# Patient Record
Sex: Female | Born: 1990 | Race: Black or African American | Hispanic: No | Marital: Single | Smoking: Never smoker
Health system: Southern US, Community
[De-identification: ages and names within clinical notes are randomized; demographics above are authoritative.]

## PROBLEM LIST (undated history)

## (undated) DIAGNOSIS — F419 Anxiety disorder, unspecified: Secondary | ICD-10-CM

## (undated) DIAGNOSIS — N809 Endometriosis, unspecified: Secondary | ICD-10-CM

---

## 2019-05-05 ENCOUNTER — Encounter (HOSPITAL_COMMUNITY): Payer: Self-pay

## 2019-05-05 ENCOUNTER — Encounter (HOSPITAL_COMMUNITY): Payer: Self-pay | Admitting: Emergency Medicine

## 2019-05-05 ENCOUNTER — Emergency Department (HOSPITAL_COMMUNITY): Payer: Self-pay

## 2019-05-05 ENCOUNTER — Other Ambulatory Visit: Payer: Self-pay

## 2019-05-05 ENCOUNTER — Emergency Department (HOSPITAL_COMMUNITY)
Admission: EM | Admit: 2019-05-05 | Discharge: 2019-05-05 | Disposition: A | Discharge status: Home / Self Care | Payer: Self-pay | Attending: Emergency Medicine | Admitting: Emergency Medicine

## 2019-05-05 DIAGNOSIS — Z79899 Other long term (current) drug therapy: Secondary | ICD-10-CM | POA: Insufficient documentation

## 2019-05-05 DIAGNOSIS — K625 Hemorrhage of anus and rectum: Secondary | ICD-10-CM | POA: Insufficient documentation

## 2019-05-05 DIAGNOSIS — R112 Nausea with vomiting, unspecified: Secondary | ICD-10-CM | POA: Insufficient documentation

## 2019-05-05 DIAGNOSIS — N83201 Unspecified ovarian cyst, right side: Secondary | ICD-10-CM | POA: Insufficient documentation

## 2019-05-05 HISTORY — DX: Endometriosis, unspecified: N80.9

## 2019-05-05 LAB — CBC WITH DIFFERENTIAL/PLATELET
Abs Immature Granulocytes: 0.01 10*3/uL (ref 0.00–0.07)
Basophils Absolute: 0 10*3/uL (ref 0.0–0.1)
Basophils Relative: 1 %
Eosinophils Absolute: 0.1 10*3/uL (ref 0.0–0.5)
Eosinophils Relative: 2 %
HCT: 39.9 % (ref 36.0–46.0)
Hemoglobin: 12.8 g/dL (ref 12.0–15.0)
Immature Granulocytes: 0 %
Lymphocytes Relative: 39 %
Lymphs Abs: 2.2 10*3/uL (ref 0.7–4.0)
MCH: 30.2 pg (ref 26.0–34.0)
MCHC: 32.1 g/dL (ref 30.0–36.0)
MCV: 94.1 fL (ref 80.0–100.0)
Monocytes Absolute: 0.4 10*3/uL (ref 0.1–1.0)
Monocytes Relative: 7 %
Neutro Abs: 2.9 10*3/uL (ref 1.7–7.7)
Neutrophils Relative %: 51 %
Platelets: 212 10*3/uL (ref 150–400)
RBC: 4.24 MIL/uL (ref 3.87–5.11)
RDW: 13.2 % (ref 11.5–15.5)
WBC: 5.7 10*3/uL (ref 4.0–10.5)
nRBC: 0 % (ref 0.0–0.2)

## 2019-05-05 LAB — URINALYSIS, ROUTINE W REFLEX MICROSCOPIC
Bilirubin Urine: NEGATIVE
Glucose, UA: NEGATIVE mg/dL
Hgb urine dipstick: NEGATIVE
Ketones, ur: NEGATIVE mg/dL
Leukocytes,Ua: NEGATIVE
Nitrite: NEGATIVE
Protein, ur: NEGATIVE mg/dL
Specific Gravity, Urine: 1.01 (ref 1.005–1.030)
pH: 6 (ref 5.0–8.0)

## 2019-05-05 LAB — COMPREHENSIVE METABOLIC PANEL
ALT: 13 U/L (ref 0–44)
AST: 21 U/L (ref 15–41)
Albumin: 3.7 g/dL (ref 3.5–5.0)
Alkaline Phosphatase: 42 U/L (ref 38–126)
Anion gap: 11 (ref 5–15)
BUN: 5 mg/dL — ABNORMAL LOW (ref 6–20)
CO2: 21 mmol/L — ABNORMAL LOW (ref 22–32)
Calcium: 9 mg/dL (ref 8.9–10.3)
Chloride: 106 mmol/L (ref 98–111)
Creatinine, Ser: 0.79 mg/dL (ref 0.44–1.00)
GFR calc Af Amer: >60 mL/min (ref >60)
GFR calc non Af Amer: >60 mL/min (ref >60)
Glucose, Bld: 110 mg/dL — ABNORMAL HIGH (ref 70–99)
Potassium: 3.6 mmol/L (ref 3.5–5.1)
Sodium: 138 mmol/L (ref 135–145)
Total Bilirubin: 0.5 mg/dL (ref 0.3–1.2)
Total Protein: 6.6 g/dL (ref 6.5–8.1)

## 2019-05-05 LAB — WET PREP, GENITAL
Clue Cells Wet Prep HPF POC: NONE SEEN
Sperm: NONE SEEN
Trich, Wet Prep: NONE SEEN
Yeast Wet Prep HPF POC: NONE SEEN

## 2019-05-05 LAB — LIPASE, BLOOD: Lipase: 27 U/L (ref 11–51)

## 2019-05-05 LAB — POC URINE PREG, ED: Preg Test, Ur: NEGATIVE

## 2019-05-05 MED ORDER — ONDANSETRON HCL 4 MG/2ML IJ SOLN
4.0000 mg | Freq: Once | INTRAMUSCULAR | Status: AC
  Administered 2019-05-05: 4 mg via INTRAVENOUS
  Filled 2019-05-05: qty 2

## 2019-05-05 MED ORDER — MORPHINE SULFATE (PF) 4 MG/ML IV SOLN
4.0000 mg | Freq: Once | INTRAVENOUS | Status: AC
  Administered 2019-05-05: 4 mg via INTRAVENOUS
  Filled 2019-05-05: qty 1

## 2019-05-05 MED ORDER — IOPAMIDOL (ISOVUE-300) INJECTION 61%
100.0000 mL | Freq: Once | INTRAVENOUS | Status: AC | PRN
  Administered 2019-05-05: 100 mL via INTRAVENOUS

## 2019-05-05 MED ORDER — ONDANSETRON HCL 4 MG PO TABS: 4.0000 mg | ORAL_TABLET | Freq: Four times a day (QID) | ORAL | 0 refills | Status: DC

## 2019-05-05 MED ORDER — NAPROXEN 500 MG PO TABS: 500.0000 mg | ORAL_TABLET | Freq: Two times a day (BID) | ORAL | 0 refills | Status: DC

## 2019-05-05 MED ORDER — KETOROLAC TROMETHAMINE 15 MG/ML IJ SOLN
15.0000 mg | Freq: Once | INTRAMUSCULAR | Status: AC
  Administered 2019-05-05: 15 mg via INTRAVENOUS
  Filled 2019-05-05: qty 1

## 2019-05-05 NOTE — ED Provider Notes (Signed)
[facility] EMERGENCY DEPARTMENT Provider Note   CSN: [id number] Arrival date & time: 05/05/19  0935    History   Chief Complaint Chief Complaint  Patient presents with  . Rectal Bleeding  . Emesis    HPI Gloria Gonzales is a 28 y.o. female.     Patient is a 28 year old female with questionable history of endometriosis presenting to the emergency department for pelvic pain, rectal bleeding, nausea.  Patient reports that this is been going on for several months.  Reports that she was seeing OB/GYN last year who thought she might possibly have endometriosis but she was lost to follow-up.  Reports that she has not been able to get into see a new OB/GYN because of the coronavirus.  Reports 7 out of 10 lower pelvic pain and cramping every single day.  No exacerbating or relieving factors.  Reports that she has had a history of irregular periods since she was a child.  Reports that 3 times in the last several months she had some rectal bleeding.  Reports that yesterday it was worse with a large amount of bleeding.  No current rectal bleeding.  No vaginal bleeding, vaginal discharge.  Reports feeling nauseated without vomiting.  Denies any fever, chills.     Past Medical History:  Diagnosis Date  . Endometriosis     There are no active problems to display for this patient.   History reviewed. No pertinent surgical history.   OB History    Gravida  1   Para      Term      Preterm      AB      Living        SAB      TAB      Ectopic      Multiple      Live Births               Home Medications    Prior to Admission medications   Medication Sig Start Date End Date Taking? Authorizing Provider  Multiple Vitamin (MULTIVITAMIN WITH MINERALS) TABS tablet Take 1 tablet by mouth daily.   Yes [provider]  naproxen (NAPROSYN) 500 MG tablet Take 1 tablet (500 mg total) by mouth 2 (two) times daily. 05/05/19   Ronnie Doss A, PA-C   ondansetron (ZOFRAN) 4 MG tablet Take 1 tablet (4 mg total) by mouth every 6 (six) hours. 05/05/19   Arlyn Dunning, PA-C    Family History No family history on file.  Social History Social History   Tobacco Use  . Smoking status: Never Smoker  Substance Use Topics  . Alcohol use: Never    Frequency: Never  . Drug use: Never     Allergies   Clonazepam   Review of Systems Review of Systems  Constitutional: Negative for chills and fever.  HENT: Negative for ear pain and sore throat.   Eyes: Negative for pain and visual disturbance.  Respiratory: Negative for cough and shortness of breath.   Cardiovascular: Negative for chest pain and palpitations.  Gastrointestinal: Positive for abdominal pain, anal bleeding, blood in stool, nausea and rectal pain. Negative for abdominal distention, constipation, diarrhea and vomiting.  Genitourinary: Positive for menstrual problem and pelvic pain. Negative for difficulty urinating, dyspareunia, dysuria, flank pain, hematuria, vaginal bleeding, vaginal discharge and vaginal pain.  Musculoskeletal: Negative for arthralgias and back pain.  Skin: Negative for color change and rash.  Neurological: Negative for seizures, syncope and light-headedness.  All other systems reviewed and are negative.    Physical Exam Updated Vital Signs BP (!) 164/101 (BP Location: Right Arm)   Pulse 62   Temp 98.6 F (37 C) (Oral)   Resp 18   Ht 5\' 6"  (1.676 m)   Wt 94.8 kg   LMP 04/12/2019   SpO2 98%   BMI 33.73 kg/m   Physical Exam Vitals signs and nursing note reviewed. Exam conducted with a chaperone present.  Constitutional:      Appearance: Normal appearance.  HENT:     Head: Normocephalic.     Mouth/Throat:     Mouth: Mucous membranes are moist.     Pharynx: Oropharynx is clear.  Eyes:     Conjunctiva/sclera: Conjunctivae normal.  Cardiovascular:     Rate and Rhythm: Normal rate and regular rhythm.  Pulmonary:     Effort: Pulmonary effort  is normal.     Breath sounds: Normal breath sounds.  Abdominal:     General: Abdomen is flat. Bowel sounds are normal. There is no distension.     Tenderness: There is no abdominal tenderness. There is no left CVA tenderness, guarding or rebound.  Genitourinary:    General: Normal vulva.     Exam position: Supine.     Pubic Area: No rash or pubic lice.      Vagina: Normal.     Cervix: Normal.     Uterus: Not tender.      Adnexa: Right adnexa normal and left adnexa normal.  Skin:    General: Skin is dry.     Capillary Refill: Capillary refill takes less than 2 seconds.  Neurological:     Mental Status: She is alert.  Psychiatric:        Mood and Affect: Mood normal.      ED Treatments / Results  Labs (all labs ordered are listed, but only abnormal results are displayed) Labs Reviewed  WET PREP, GENITAL - Abnormal; Notable for the following components:      Result Value   WBC, Wet Prep HPF POC MODERATE (*)    All other components within normal limits  COMPREHENSIVE METABOLIC PANEL - Abnormal; Notable for the following components:   CO2 21 (*)    Glucose, Bld 110 (*)    BUN 5 (*)    All other components within normal limits  CBC WITH DIFFERENTIAL/PLATELET  LIPASE, BLOOD  URINALYSIS, ROUTINE W REFLEX MICROSCOPIC  OCCULT BLOOD X 1 CARD TO LAB, STOOL  POC URINE PREG, ED  GC/CHLAMYDIA PROBE AMP ([place]) NOT AT [facility]    EKG None  Radiology Ct Abdomen Pelvis W Contrast  Result Date: 05/05/2019 CLINICAL DATA:  Initial evaluation for abdominal pain and distension since December. EXAM: CT ABDOMEN AND PELVIS WITH CONTRAST TECHNIQUE: Multidetector CT imaging of the abdomen and pelvis was performed using the standard protocol following bolus administration of intravenous contrast. CONTRAST:  100 cc of Omni 300. COMPARISON:  None. FINDINGS: Lower chest: Visualized lung bases are clear. Hepatobiliary: 13 mm hypodense lesion within the posterior right hepatic lobe, indeterminate  (series 3, image 26). Focal fat deposition noted adjacent to the falciform ligament. Liver otherwise unremarkable. Gallbladder within normal limits. No biliary dilatation. Pancreas: Pancreas within normal limits. Spleen: Spleen within normal limits. Adrenals/Urinary Tract: Adrenal glands are normal. Kidneys equal in size with symmetric enhancement. No nephrolithiasis, hydronephrosis, or focal enhancing renal mass. No appreciable hydroureter. Partially distended bladder within normal limits. Stomach/Bowel: Stomach within normal limits. No evidence for bowel obstruction.  Normal appendix. No acute inflammatory changes seen about the bowels. Vascular/Lymphatic: Normal intravascular enhancement seen throughout the intra-abdominal aorta and its branch vessels. No pathologically enlarged intra-abdominal or pelvic lymph nodes. Reproductive: Uterus within normal limits. Left ovary unremarkable. 18 mm degenerating corpus luteal cyst noted within the right ovary. Other: Small volume free physiologic fluid present within the pelvis and right adnexa. No free intraperitoneal air. Musculoskeletal: No acute osseous abnormality. No discrete lytic or blastic osseous lesions. IMPRESSION: 1. 18 mm degenerating right ovarian corpus luteal cyst with associated small volume free physiologic fluid within the pelvis. 2. No other acute abnormality identified within the abdomen and pelvis. 3. 13 mm hypodense lesion within the posterior right hepatic lobe, indeterminate. While this finding is suspected to a benign hemangioma, this is incompletely characterized on this examination. Follow-up with nonemergent dedicated MRI of the abdomen, with without contrast, is suggested for complete evaluation. Electronically Signed   By: Rise Mu M.D.   On: 05/05/2019 13:52    Procedures Procedures (including critical care time)  Medications Ordered in ED Medications  morphine 4 MG/ML injection 4 mg (has no administration in time range)   ketorolac (TORADOL) 15 MG/ML injection 15 mg (15 mg Intravenous Given 05/05/19 1307)  ondansetron (ZOFRAN) injection 4 mg (4 mg Intravenous Given 05/05/19 1307)  iopamidol (ISOVUE-300) 61 % injection 100 mL (100 mLs Intravenous Contrast Given 05/05/19 1333)  ondansetron (ZOFRAN) injection 4 mg (4 mg Intravenous Given 05/05/19 1424)     Initial Impression / Assessment and Plan / ED Course  I have reviewed the triage vital signs and the nursing notes.  Pertinent labs & imaging results that were available during my care of the patient were reviewed by me and considered in my medical decision making (see chart for details).  Clinical Course as of May 04 1424  Fri May 05, 2019  1022 27 y/o F with pelvic pain for several months and rectal bleeding x3 in the last several months. She could not get a new obgyn so she came to the ED. No active bleeding. Unsure if the two problems are related or not. Will obtain labs, pelvic exam, UA, CT scan. Likely non-emergent process and can f/u with obgyn and PMD if workup negative.    [KM]  1416 Labs appearing normal. CT showing right ovarian cyst and probable hepatic hemangioma, both if these things discussed with patient as well as proper follow up. Discussed with patient to f/u Obgyn. Send home with NSAIDs and ondansetron   [KM]    Clinical Course User Index [KM] Arlyn Dunning, PA-C       Based on review of vitals, medical screening exam, lab work and/or imaging, there does not appear to be an acute, emergent etiology for the patient's symptoms. Counseled pt on good return precautions and encouraged both PCP and ED follow-up as needed.  Prior to discharge, I also discussed incidental imaging findings with patient in detail and advised appropriate, recommended follow-up in detail.  Clinical Impression: 1. Rectal bleeding   2. Non-intractable vomiting with nausea, unspecified vomiting type   3. Ovarian cyst, right     Disposition: Discharge  Prior to  providing a prescription for a controlled substance, I independently reviewed the patient's recent prescription history on the [place] Controlled Substance Reporting System. The patient had no recent or regular prescriptions and was deemed appropriate for a brief, less than 3 day prescription of narcotic for acute analgesia.  This note was prepared with assistance of Dragon voice  recognition software. Occasional wrong-word or sound-a-like substitutions may have occurred due to the inherent limitations of voice recognition software.   Final Clinical Impressions(s) / ED Diagnoses   Final diagnoses:  Rectal bleeding  Non-intractable vomiting with nausea, unspecified vomiting type  Ovarian cyst, right    ED Discharge Orders         Ordered    ondansetron (ZOFRAN) 4 MG tablet  Every 6 hours     05/05/19 1420    naproxen (NAPROSYN) 500 MG tablet  2 times daily     05/05/19 [address] 05/05/19 1425    Cur[place], Adam, DO 05/05/19 1607

## 2019-05-05 NOTE — Discharge Instructions (Signed)
Thank you for allowing me to care for you today. Please return to the emergency department if you have new or worsening symptoms. Take your medications as instructed.  ° °

## 2019-05-05 NOTE — ED Triage Notes (Signed)
History of endometriosis. Patient developed abdominal cramping in Nov 2019 not during her cycle and in Dec 2019 thought she had the stomach flu. Since then states had rectal bleeding 2-3 times and yesterday stated "a lot of bright blood came out of her rectum. Since having these symptoms states having in Dec patient having diarrhea and emesis. Last BM was 0300 today diarrhea loose stool brown with vomiting this morning.

## 2019-05-08 LAB — GC/CHLAMYDIA PROBE AMP (~~LOC~~) NOT AT ARMC
Chlamydia: NEGATIVE
Neisseria Gonorrhea: NEGATIVE

## 2019-05-11 ENCOUNTER — Emergency Department (HOSPITAL_BASED_OUTPATIENT_CLINIC_OR_DEPARTMENT_OTHER)
Admission: EM | Admit: 2019-05-11 | Discharge: 2019-05-12 | Disposition: A | Discharge status: Home / Self Care | Payer: Self-pay | Attending: Emergency Medicine | Admitting: Emergency Medicine

## 2019-05-11 ENCOUNTER — Emergency Department (HOSPITAL_BASED_OUTPATIENT_CLINIC_OR_DEPARTMENT_OTHER): Payer: Self-pay

## 2019-05-11 ENCOUNTER — Encounter (HOSPITAL_BASED_OUTPATIENT_CLINIC_OR_DEPARTMENT_OTHER): Payer: Self-pay | Admitting: *Deleted

## 2019-05-11 ENCOUNTER — Other Ambulatory Visit: Payer: Self-pay

## 2019-05-11 DIAGNOSIS — R1031 Right lower quadrant pain: Secondary | ICD-10-CM | POA: Insufficient documentation

## 2019-05-11 DIAGNOSIS — B9689 Other specified bacterial agents as the cause of diseases classified elsewhere: Secondary | ICD-10-CM

## 2019-05-11 DIAGNOSIS — R112 Nausea with vomiting, unspecified: Secondary | ICD-10-CM | POA: Insufficient documentation

## 2019-05-11 DIAGNOSIS — N760 Acute vaginitis: Secondary | ICD-10-CM | POA: Insufficient documentation

## 2019-05-11 DIAGNOSIS — R197 Diarrhea, unspecified: Secondary | ICD-10-CM | POA: Insufficient documentation

## 2019-05-11 DIAGNOSIS — Z79899 Other long term (current) drug therapy: Secondary | ICD-10-CM | POA: Insufficient documentation

## 2019-05-11 LAB — COMPREHENSIVE METABOLIC PANEL
ALT: 11 U/L (ref 0–44)
AST: 15 U/L (ref 15–41)
Albumin: 4.6 g/dL (ref 3.5–5.0)
Alkaline Phosphatase: 45 U/L (ref 38–126)
Anion gap: 11 (ref 5–15)
BUN: 8 mg/dL (ref 6–20)
CO2: 23 mmol/L (ref 22–32)
Calcium: 9.1 mg/dL (ref 8.9–10.3)
Chloride: 105 mmol/L (ref 98–111)
Creatinine, Ser: 0.79 mg/dL (ref 0.44–1.00)
GFR calc Af Amer: >60 mL/min (ref >60)
GFR calc non Af Amer: >60 mL/min (ref >60)
Glucose, Bld: 113 mg/dL — ABNORMAL HIGH (ref 70–99)
Potassium: 3.8 mmol/L (ref 3.5–5.1)
Sodium: 139 mmol/L (ref 135–145)
Total Bilirubin: 0.9 mg/dL (ref 0.3–1.2)
Total Protein: 7.9 g/dL (ref 6.5–8.1)

## 2019-05-11 LAB — WET PREP, GENITAL
Sperm: NONE SEEN
Trich, Wet Prep: NONE SEEN
Yeast Wet Prep HPF POC: NONE SEEN

## 2019-05-11 LAB — URINALYSIS, MICROSCOPIC (REFLEX)

## 2019-05-11 LAB — URINALYSIS, ROUTINE W REFLEX MICROSCOPIC
Bilirubin Urine: NEGATIVE
Glucose, UA: NEGATIVE mg/dL
Ketones, ur: 40 mg/dL — AB
Leukocytes,Ua: NEGATIVE
Nitrite: NEGATIVE
Protein, ur: NEGATIVE mg/dL
Specific Gravity, Urine: >1.03 — ABNORMAL HIGH (ref 1.005–1.030)
pH: 6 (ref 5.0–8.0)

## 2019-05-11 LAB — CBC
HCT: 40.3 % (ref 36.0–46.0)
Hemoglobin: 13.2 g/dL (ref 12.0–15.0)
MCH: 30.6 pg (ref 26.0–34.0)
MCHC: 32.8 g/dL (ref 30.0–36.0)
MCV: 93.3 fL (ref 80.0–100.0)
Platelets: 239 10*3/uL (ref 150–400)
RBC: 4.32 MIL/uL (ref 3.87–5.11)
RDW: 12.9 % (ref 11.5–15.5)
WBC: 7 10*3/uL (ref 4.0–10.5)
nRBC: 0 % (ref 0.0–0.2)

## 2019-05-11 LAB — LIPASE, BLOOD: Lipase: 28 U/L (ref 11–51)

## 2019-05-11 LAB — PREGNANCY, URINE: Preg Test, Ur: NEGATIVE

## 2019-05-11 MED ORDER — MORPHINE SULFATE (PF) 4 MG/ML IV SOLN
4.0000 mg | Freq: Once | INTRAVENOUS | Status: AC
  Administered 2019-05-11: 23:00:00 4 mg via INTRAVENOUS
  Filled 2019-05-11: qty 1

## 2019-05-11 MED ORDER — IOHEXOL 300 MG/ML  SOLN
100.0000 mL | Freq: Once | INTRAMUSCULAR | Status: AC | PRN
  Administered 2019-05-11: 23:00:00 100 mL via INTRAVENOUS

## 2019-05-11 MED ORDER — ONDANSETRON HCL 4 MG/2ML IJ SOLN
4.0000 mg | Freq: Once | INTRAMUSCULAR | Status: AC | PRN
  Administered 2019-05-11: 4 mg via INTRAVENOUS
  Filled 2019-05-11: qty 2

## 2019-05-11 MED ORDER — METRONIDAZOLE 500 MG PO TABS: 500.0000 mg | ORAL_TABLET | Freq: Two times a day (BID) | ORAL | 0 refills | Status: DC

## 2019-05-11 MED ORDER — SODIUM CHLORIDE 0.9 % IV BOLUS
1000.0000 mL | Freq: Once | INTRAVENOUS | Status: AC
  Administered 2019-05-11: 22:00:00 1000 mL via INTRAVENOUS

## 2019-05-11 NOTE — ED Provider Notes (Signed)
[facility] EMERGENCY DEPARTMENT Provider Note   CSN: [id number] Arrival date & time: 05/11/19  2034    History   Chief Complaint Chief Complaint  Patient presents with   Nausea    HPI Gloria Gonzales is a 28 y.o. female with a PMH of endometriosis and ovarian cyst presenting with nausea, vomiting, diarrhea, and RLQ abdominal pain. Patient reports nausea and vomiting began a few weeks ago. Patient reports abdominal pain has been present since December. Patient states pain has been worse today. Patient states she started her period today. Patient reports her pain is typically worse during her menstrual period. Patient denies taking any medications. Patient reports 10 episodes of non bloody non bilious vomiting over the last 24 hours. Patient reports 3 episodes of light brown non bloody diarrhea. Patient denies fever, chills, cough, congestion, sore throat, chest pain, shortness of breath, sick exposures, or recent travel. Patient denies dysuria, hematuria, or flank pain. Patient denies vaginal discharge or hx of STIs. Patient denies any abdominal surgeries. Patient states she has not followed up with an OBGYN in a few years. Patient states she has not followed up with GI in the past.      HPI  Past Medical History:  Diagnosis Date   Endometriosis     There are no active problems to display for this patient.   History reviewed. No pertinent surgical history.   OB History    Gravida  1   Para      Term      Preterm      AB      Living        SAB      TAB      Ectopic      Multiple      Live Births               Home Medications    Prior to Admission medications   Medication Sig Start Date End Date Taking? Authorizing Provider  Multiple Vitamin (MULTIVITAMIN WITH MINERALS) TABS tablet Take 1 tablet by mouth daily.    [provider]  naproxen (NAPROSYN) 500 MG tablet Take 1 tablet (500 mg total) by mouth 2 (two) times daily. 05/05/19    Ronnie Doss A, PA-C  ondansetron (ZOFRAN) 4 MG tablet Take 1 tablet (4 mg total) by mouth every 6 (six) hours. 05/05/19   Arlyn Dunning, PA-C    Family History No family history on file.  Social History Social History   Tobacco Use   Smoking status: Never Smoker   Smokeless tobacco: Never Used  Substance Use Topics   Alcohol use: Never    Frequency: Never   Drug use: Never     Allergies   Clonazepam   Review of Systems Review of Systems  Constitutional: Negative for activity change, appetite change, chills, fever and unexpected weight change.  HENT: Negative for congestion, rhinorrhea and sore throat.   Eyes: Negative for visual disturbance.  Respiratory: Negative for cough and shortness of breath.   Cardiovascular: Negative for chest pain.  Gastrointestinal: Positive for abdominal pain, diarrhea, nausea and vomiting. Negative for blood in stool and constipation.  Endocrine: Negative for polydipsia, polyphagia and polyuria.  Genitourinary: Negative for dysuria, flank pain and frequency.  Musculoskeletal: Negative for back pain.  Skin: Negative for rash.  Allergic/Immunologic: Negative for immunocompromised state.  Neurological: Negative for weakness and headaches.  Psychiatric/Behavioral: The patient is not nervous/anxious.     Physical Exam Updated Vital Signs  BP (!) 124/94    Pulse 70    Temp 98.1 F (36.7 C) (Oral)    Resp 20    Ht 5\' 6"  (1.676 m)    Wt 94.8 kg    LMP 04/12/2019    SpO2 99%    Breastfeeding Unknown    BMI 33.73 kg/m   Physical Exam Vitals signs and nursing note reviewed. Exam conducted with a chaperone present.  Constitutional:      General: She is not in acute distress.    Appearance: She is well-developed. She is not diaphoretic.     Comments: Patient appears uncomfortable during exam.  HENT:     Head: Normocephalic and atraumatic.     Nose: Nose normal. No rhinorrhea.     Mouth/Throat:     Mouth: Mucous membranes are dry.      Pharynx: No posterior oropharyngeal erythema.  Neck:     Musculoskeletal: Normal range of motion and neck supple.  Cardiovascular:     Rate and Rhythm: Normal rate and regular rhythm.     Heart sounds: Normal heart sounds. No murmur. No friction rub. No gallop.   Pulmonary:     Effort: Pulmonary effort is normal. No respiratory distress.     Breath sounds: Normal breath sounds. No wheezing or rales.  Abdominal:     General: Bowel sounds are normal. There is no distension.     Palpations: Abdomen is soft. Abdomen is not rigid. There is no mass.     Tenderness: There is abdominal tenderness in the right lower quadrant. There is guarding. There is no right CVA tenderness or rebound. Positive signs include McBurney's sign.     Hernia: No hernia is present.  Genitourinary:    Labia:        Right: No tenderness or lesion.        Left: No tenderness or lesion.      Vagina: No signs of injury. Bleeding present. No vaginal discharge, erythema, tenderness or lesions.     Cervix: Cervical bleeding present. No cervical motion tenderness or discharge.     Uterus: Normal.      Adnexa: Right adnexa normal and left adnexa normal.     Comments: Bleeding present on exam consistent with menstruation. No signs of injury. No discharge.  Musculoskeletal: Normal range of motion.  Skin:    General: Skin is warm.     Findings: No rash.  Neurological:     Mental Status: She is alert and oriented to person, place, and time.    ED Treatments / Results  Labs (all labs ordered are listed, but only abnormal results are displayed) Labs Reviewed  WET PREP, GENITAL - Abnormal; Notable for the following components:      Result Value   Clue Cells Wet Prep HPF POC PRESENT (*)    WBC, Wet Prep HPF POC MODERATE (*)    All other components within normal limits  COMPREHENSIVE METABOLIC PANEL - Abnormal; Notable for the following components:   Glucose, Bld 113 (*)    All other components within normal limits    URINALYSIS, ROUTINE W REFLEX MICROSCOPIC - Abnormal; Notable for the following components:   APPearance HAZY (*)    Specific Gravity, Urine >1.030 (*)    Hgb urine dipstick LARGE (*)    Ketones, ur 40 (*)    All other components within normal limits  URINALYSIS, MICROSCOPIC (REFLEX) - Abnormal; Notable for the following components:   Bacteria, UA RARE (*)  All other components within normal limits  LIPASE, BLOOD  CBC  PREGNANCY, URINE  GC/CHLAMYDIA PROBE AMP ([place]) NOT AT [facility]    EKG None  Radiology Ct Abdomen Pelvis W Contrast  Result Date: 05/11/2019 CLINICAL DATA:  Abdominal pain and nausea for 5 months EXAM: CT ABDOMEN AND PELVIS WITH CONTRAST TECHNIQUE: Multidetector CT imaging of the abdomen and pelvis was performed using the standard protocol following bolus administration of intravenous contrast. CONTRAST:  100 mL OMNIPAQUE IOHEXOL 300 MG/ML  SOLN COMPARISON:  None. FINDINGS: Lower chest: Lung bases clear.  No pleural or pericardial effusion. Hepatobiliary: No focal liver abnormality is seen. No gallstones, gallbladder wall thickening, or biliary dilatation. Pancreas: Unremarkable. No pancreatic ductal dilatation or surrounding inflammatory changes. Spleen: Normal in size without focal abnormality. Adrenals/Urinary Tract: Adrenal glands are unremarkable. Kidneys are normal, without renal calculi, focal lesion, or hydronephrosis. Bladder is unremarkable. Stomach/Bowel: Stomach is within normal limits. Appendix appears normal. No evidence of bowel wall thickening, distention, or inflammatory changes. Vascular/Lymphatic: No significant vascular findings are present. No enlarged abdominal or pelvic lymph nodes. Reproductive: Uterus and bilateral adnexa are unremarkable. Other: Small amount of free pelvic fluid consistent with physiologic change noted. Musculoskeletal: Negative. IMPRESSION: Negative CT abdomen and pelvis. No finding to explain the patient's symptoms. Electronically  Signed   By: Drusilla Kanner M.D.   On: 05/11/2019 23:34    Procedures Procedures (including critical care time)  Medications Ordered in ED Medications  ondansetron (ZOFRAN) injection 4 mg (4 mg Intravenous Given 05/11/19 2146)  sodium chloride 0.9 % bolus 1,000 mL (1,000 mLs Intravenous New Bag/Given 05/11/19 2218)  morphine 4 MG/ML injection 4 mg (4 mg Intravenous Given 05/11/19 2232)  iohexol (OMNIPAQUE) 300 MG/ML solution 100 mL (100 mLs Intravenous Contrast Given 05/11/19 2310)     Initial Impression / Assessment and Plan / ED Course  I have reviewed the triage vital signs and the nursing notes.  Pertinent labs & imaging results that were available during my care of the patient were reviewed by me and considered in my medical decision making (see chart for details).  Clinical Course as of May 11 2343  Thu May 11, 2019  2227 UA reveals large Hgb, ketones, RBCs, and rare bacteria.   Hgb urine dipstick(!): LARGE [AH]  2332 Patient reports pain has significantly improved. Patient states nausea and vomiting has completely resolved.   [AH]  2332 CBC is unremarkable.   CBC [AH]  2333 CMP is within normal limits except mild hyperglycemia noted at 113.  Comprehensive metabolic panel(!) [AH]  2333 Urinalysis, Routine w reflex microscopic(!) [AH]  2338 Negative CT abdomen and pelvis. No finding to explain the patient's symptoms.    CT Abdomen Pelvis W Contrast [AH]  2344 Clue cells and WBCs present on wet prep.   Clue Cells Wet Prep HPF POC(!): PRESENT [AH]    Clinical Course User Index [AH] Leretha Dykes, PA-C      Patient presents with abdominal pain. Patient initially appeared uncomfortable and had vomiting while in the ER. Patient's pain and other symptoms adequately managed in emergency department.  Fluid bolus, zofran, and pain medicine given.  Symptoms have significantly improved. Patient is nontoxic, nonseptic appearing, in no apparent distress.  Labs, imaging and vitals  reviewed.  Patient does not meet the SIRS or Sepsis criteria.  On repeat exam patient does not have a surgical abdomin and there are no peritoneal signs. Concern for appendicitis due to RLQ abdominal pain. CT abdomen is negative. No  indication of bowel obstruction, bowel perforation, cholecystitis, diverticulitis, PID or ectopic pregnancy. Wet prep reveals clue cells. Will prescribe metronidazole. Advised patient not to drink while taking this medication. Patient is stable and in no acute distress at this time. Patient's symptoms have resolved while in the ER. Patient discharged home with symptomatic treatment and given strict instructions for follow-up with their primary care physician and OBGYN.  Patient reports she has antiemetics at home. I have also discussed reasons to return immediately to the ER.  Patient expresses understanding and agrees with plan.  Final Clinical Impressions(s) / ED Diagnoses   Final diagnoses:  Nausea vomiting and diarrhea  Right lower quadrant abdominal pain    ED Discharge Orders    None       Leretha Dykes, [place] 05/11/19 2352    Sabas Sous, MD 05/15/19 1200

## 2019-05-11 NOTE — ED Triage Notes (Signed)
Nausea x 5 months.

## 2019-05-11 NOTE — ED Notes (Signed)
Pt given ginger ale.

## 2019-05-11 NOTE — ED Notes (Signed)
Patient transported to CT 

## 2019-05-11 NOTE — Discharge Instructions (Addendum)
You have been seen today for nausea, vomiting, diarrhea, and abdominal pain. Please read and follow all provided instructions.   1. Medications: zofran for nausea/vomiting as previously prescribed, ibuprofen/tylenol for pain, metronidazole (antibiotic - do not drink alcohol while taking this medication), usual home medications 2. Treatment: rest, drink plenty of fluids 3. Follow Up: Please follow up with your primary doctor and OBGYN in 2 days for discussion of your diagnoses and further evaluation after today's visit; if you do not have a primary care doctor use the resource guide provided to find one; Please return to the ER for any new or worsening symptoms. Please obtain all of your results from medical records or have your doctors office obtain the results - share them with your doctor - you should be seen at your doctors office. Call today to arrange your follow up.   Take medications as prescribed. Please review all of the medicines and only take them if you do not have an allergy to them. Return to the emergency room for worsening condition or new concerning symptoms. Follow up with your regular doctor. If you don't have a regular doctor use one of the numbers below to establish a primary care doctor.  Please be aware that if you are taking birth control pills, taking other prescriptions, ESPECIALLY ANTIBIOTICS may make the birth control ineffective - if this is the case, either do not engage in sexual activity or use alternative methods of birth control such as condoms until you have finished the medicine and your family doctor says it is OK to restart them. If you are on a blood thinner such as COUMADIN, be aware that any other medicine that you take may cause the coumadin to either work too much, or not enough - you should have your coumadin level rechecked in next 7 days if this is the case.  ?  It is also a possibility that you have an allergic reaction to any of the medicines that you have  been prescribed - Everybody reacts differently to medications and while MOST people have no trouble with most medicines, you may have a reaction such as nausea, vomiting, rash, swelling, shortness of breath. If this is the case, please stop taking the medicine immediately and contact your physician.  ?  You should return to the ER if you develop severe or worsening symptoms.   Emergency Department Resource Guide 1) Find a Doctor and Pay Out of Pocket Although you won't have to find out who is covered by your insurance plan, it is a good idea to ask around and get recommendations. You will then need to call the office and see if the doctor you have chosen will accept you as a new patient and what types of options they offer for patients who are self-pay. Some doctors offer discounts or will set up payment plans for their patients who do not have insurance, but you will need to ask so you aren't surprised when you get to your appointment.  2) Contact Your Local Health Department Not all health departments have doctors that can see patients for sick visits, but many do, so it is worth a call to see if yours does. If you don't know where your local health department is, you can check in your phone book. The CDC also has a tool to help you locate your state's health department, and many state websites also have listings of all of their local health departments.  3) Find a Walk-in Clinic If  your illness is not likely to be very severe or complicated, you may want to try a walk in clinic. These are popping up all over the country in pharmacies, drugstores, and shopping centers. They're usually staffed by nurse practitioners or physician assistants that have been trained to treat common illnesses and complaints. They're usually fairly quick and inexpensive. However, if you have serious medical issues or chronic medical problems, these are probably not your best option.  No Primary Care Doctor: Call Health  Connect at  [phone] - they can help you locate a primary care doctor that  accepts your insurance, provides certain services, etc. Physician Referral Service- [phone]  Chronic Pain Problems: Organization         Address  Phone   Notes  [facility]  [phone] Patients need to be referred by their primary care doctor.   Medication Assistance: Organization         Address  Phone   Notes  [facility] [address] [place], [place] [address] [phone] --Must be a resident of [facility] -- Must have NO insurance coverage whatsoever (no Medicaid/ Medicare, etc.) -- The pt. MUST have a primary care doctor that directs their care regularly and follows them in the community   [facility]  [phone]   [employer]  [phone]    Agencies that provide inexpensive medical care: Organization         Address  Phone   Notes  [facility]  [phone]   [facility]    [phone]   [facility] [address] [place], [place] [address] [phone]   [facility] [address], [place] [phone]   [facility]    [phone]   [facility]    [phone]   [facility]  [address], [place] Phone:  [phone], Fax:  [phone] Hours of Operation:  9 am - 6 pm, M-F.  Also accepts Medicaid/Medicare and self-pay.  [facility]  [address], [place] Phone: [phone], Fax: [phone]. Hours of Operation:  8:30 am - 5:30 pm, M-F.  Also accepts Medicaid and self-pay.  [facility] [place] [address], [place] Phone: [phone]   [facility] [address], [place], [place] [phone], Ext. [phone] Mondays & Thursdays: 7-9 AM.  First 15 patients are seen on a first come, first serve basis.     Medicaid-accepting [facility] Providers:  Organization         Address  Phone   Notes  [facility] [address], [place] [phone] Also accepts self-pay patients.  [facility] [address], [place]  [phone]   [facility] [address], [place] [phone]   [facility] [address] [place] [address], [place] [phone]   Renaye RakersVeita Bland [address], [place]   [phone] Only accepts [place] patients after they have their name applied to their card.   Self-Pay (no insurance) in [facility]:  Organization         Address  Phone   Notes  Sickle Cell Patients, [facility] [address], [place] [phone]  [facility] [place] [phone]   [facility] [address], [place] [phone]   [facility]/Dr. Osei-Bonsu  [address], [place] or [address], [place] [phone] Phone number for both [place] and [place] locations is the same.  [facility] [address] [place] [phone]   [facility] [address], [place] or [address] [phone]   [facility] [address] [place] [phone], phone; [phone], fax Sees patients 1st and 3rd Saturday of every month.  Must not qualify for public or private insurance (i.e. Medicaid, Medicare, [place] Health Choice, Veterans' Benefits)  Household income should be no more than 200% of the poverty level The clinic cannot treat you if you are pregnant or think you are pregnant  Sexually transmitted diseases are not treated at the clinic.

## 2019-05-12 ENCOUNTER — Other Ambulatory Visit: Payer: Self-pay

## 2019-05-12 ENCOUNTER — Encounter (HOSPITAL_BASED_OUTPATIENT_CLINIC_OR_DEPARTMENT_OTHER): Payer: Self-pay | Admitting: Emergency Medicine

## 2019-05-12 ENCOUNTER — Emergency Department (HOSPITAL_BASED_OUTPATIENT_CLINIC_OR_DEPARTMENT_OTHER)
Admission: EM | Admit: 2019-05-12 | Discharge: 2019-05-13 | Disposition: A | Discharge status: Home / Self Care | Payer: Self-pay | Attending: Emergency Medicine | Admitting: Emergency Medicine

## 2019-05-12 DIAGNOSIS — R109 Unspecified abdominal pain: Secondary | ICD-10-CM

## 2019-05-12 DIAGNOSIS — R112 Nausea with vomiting, unspecified: Secondary | ICD-10-CM | POA: Insufficient documentation

## 2019-05-12 DIAGNOSIS — Z79899 Other long term (current) drug therapy: Secondary | ICD-10-CM | POA: Insufficient documentation

## 2019-05-12 LAB — CBC WITH DIFFERENTIAL/PLATELET
Abs Immature Granulocytes: 0.03 10*3/uL (ref 0.00–0.07)
Basophils Absolute: 0 10*3/uL (ref 0.0–0.1)
Basophils Relative: 0 %
Eosinophils Absolute: 0 10*3/uL (ref 0.0–0.5)
Eosinophils Relative: 0 %
HCT: 41.1 % (ref 36.0–46.0)
Hemoglobin: 13.5 g/dL (ref 12.0–15.0)
Immature Granulocytes: 1 %
Lymphocytes Relative: 18 %
Lymphs Abs: 1.1 10*3/uL (ref 0.7–4.0)
MCH: 30.7 pg (ref 26.0–34.0)
MCHC: 32.8 g/dL (ref 30.0–36.0)
MCV: 93.4 fL (ref 80.0–100.0)
Monocytes Absolute: 0.1 10*3/uL (ref 0.1–1.0)
Monocytes Relative: 2 %
Neutro Abs: 4.9 10*3/uL (ref 1.7–7.7)
Neutrophils Relative %: 79 %
Platelets: 247 10*3/uL (ref 150–400)
RBC: 4.4 MIL/uL (ref 3.87–5.11)
RDW: 12.9 % (ref 11.5–15.5)
WBC: 6.2 10*3/uL (ref 4.0–10.5)
nRBC: 0 % (ref 0.0–0.2)

## 2019-05-12 MED ORDER — KETOROLAC TROMETHAMINE 30 MG/ML IJ SOLN
30.0000 mg | Freq: Once | INTRAMUSCULAR | Status: AC
  Administered 2019-05-12: 30 mg via INTRAVENOUS
  Filled 2019-05-12: qty 1

## 2019-05-12 MED ORDER — DROPERIDOL 2.5 MG/ML IJ SOLN
2.5000 mg | Freq: Once | INTRAMUSCULAR | Status: AC
  Administered 2019-05-12: 2.5 mg via INTRAVENOUS
  Filled 2019-05-12: qty 2

## 2019-05-12 MED ORDER — MORPHINE SULFATE (PF) 4 MG/ML IV SOLN
4.0000 mg | Freq: Once | INTRAVENOUS | Status: AC
  Administered 2019-05-12: 4 mg via INTRAVENOUS
  Filled 2019-05-12: qty 1

## 2019-05-12 MED ORDER — SODIUM CHLORIDE 0.9 % IV BOLUS
1000.0000 mL | Freq: Once | INTRAVENOUS | Status: AC
  Administered 2019-05-12: 1000 mL via INTRAVENOUS

## 2019-05-12 NOTE — ED Triage Notes (Signed)
Pt states that she was dx with an ovarian cyst. Pt states that she has vomited approx 15-20 times today. Was seen yesterday for the same

## 2019-05-12 NOTE — ED Notes (Signed)
Pt has called out twice requesting pain medication. RN informed patient that EDP is aware and will be by to assess her. Will continue to monitor.

## 2019-05-12 NOTE — ED Provider Notes (Signed)
[facility] EMERGENCY DEPARTMENT Provider Note   CSN: [id number] Arrival date & time: 05/12/19  2249    History   Chief Complaint Chief Complaint  Patient presents with  . Vomiting    HPI Gloria Gonzales is a 28 y.o. female.     Patient is a 28 year old female with past medical history of endometriosis.  She presents today with complaints of severe abdominal pain, nausea and vomiting.  This is been ongoing for the past 5 months, but has worsened over the past several weeks.  This is the patient's third visit to the ER in the past week with similar complaints.  She describes severe pain to her upper abdomen along with nausea and vomiting states she is unable to keep anything down.  She denies any bowel or bladder complaints.  She denies any fevers or chills.  She denies any prior abdominal surgeries.  The history is provided by the patient.    Past Medical History:  Diagnosis Date  . Endometriosis     There are no active problems to display for this patient.   History reviewed. No pertinent surgical history.   OB History    Gravida  1   Para      Term      Preterm      AB      Living        SAB      TAB      Ectopic      Multiple      Live Births               Home Medications    Prior to Admission medications   Medication Sig Start Date End Date Taking? Authorizing Provider  metroNIDAZOLE (FLAGYL) 500 MG tablet Take 1 tablet (500 mg total) by mouth 2 (two) times daily. 05/11/19   Carlyle Basques P, PA-C  Multiple Vitamin (MULTIVITAMIN WITH MINERALS) TABS tablet Take 1 tablet by mouth daily.    [provider]  naproxen (NAPROSYN) 500 MG tablet Take 1 tablet (500 mg total) by mouth 2 (two) times daily. 05/05/19   Ronnie Doss A, PA-C  ondansetron (ZOFRAN) 4 MG tablet Take 1 tablet (4 mg total) by mouth every 6 (six) hours. 05/05/19   Arlyn Dunning, PA-C    Family History No family history on file.  Social History Social  History   Tobacco Use  . Smoking status: Never Smoker  . Smokeless tobacco: Never Used  Substance Use Topics  . Alcohol use: Never    Frequency: Never  . Drug use: Never     Allergies   Clonazepam   Review of Systems Review of Systems  All other systems reviewed and are negative.    Physical Exam Updated Vital Signs BP (!) 145/92 (BP Location: Left Arm)   Pulse 61   Temp 98.1 F (36.7 C) (Oral)   Resp (!) 22   Wt 89.8 kg   LMP 04/12/2019   SpO2 100%   BMI 31.96 kg/m   Physical Exam Vitals signs and nursing note reviewed.  Constitutional:      General: She is not in acute distress.    Appearance: She is well-developed. She is not toxic-appearing or diaphoretic.     Comments: Patient is hyperventilating, moaning, and thrashing around the exam stretcher.  HENT:     Head: Normocephalic and atraumatic.  Neck:     Musculoskeletal: Normal range of motion and neck supple.  Cardiovascular:  Rate and Rhythm: Normal rate and regular rhythm.     Heart sounds: No murmur. No friction rub. No gallop.   Pulmonary:     Effort: Pulmonary effort is normal. No respiratory distress.     Breath sounds: Normal breath sounds. No wheezing.  Abdominal:     General: Bowel sounds are normal. There is no distension.     Palpations: Abdomen is soft.     Tenderness: There is abdominal tenderness. There is no right CVA tenderness, left CVA tenderness, guarding or rebound.     Comments: There is tenderness to the epigastric region and right upper and left upper quadrants.  Musculoskeletal: Normal range of motion.  Skin:    General: Skin is warm and dry.  Neurological:     Mental Status: She is alert and oriented to person, place, and time.      ED Treatments / Results  Labs (all labs ordered are listed, but only abnormal results are displayed) Labs Reviewed  COMPREHENSIVE METABOLIC PANEL  LIPASE, BLOOD  CBC WITH DIFFERENTIAL/PLATELET  URINALYSIS, ROUTINE W REFLEX MICROSCOPIC   PREGNANCY, URINE    EKG None  Radiology Ct Abdomen Pelvis W Contrast  Result Date: 05/11/2019 CLINICAL DATA:  Abdominal pain and nausea for 5 months EXAM: CT ABDOMEN AND PELVIS WITH CONTRAST TECHNIQUE: Multidetector CT imaging of the abdomen and pelvis was performed using the standard protocol following bolus administration of intravenous contrast. CONTRAST:  100 mL OMNIPAQUE IOHEXOL 300 MG/ML  SOLN COMPARISON:  None. FINDINGS: Lower chest: Lung bases clear.  No pleural or pericardial effusion. Hepatobiliary: No focal liver abnormality is seen. No gallstones, gallbladder wall thickening, or biliary dilatation. Pancreas: Unremarkable. No pancreatic ductal dilatation or surrounding inflammatory changes. Spleen: Normal in size without focal abnormality. Adrenals/Urinary Tract: Adrenal glands are unremarkable. Kidneys are normal, without renal calculi, focal lesion, or hydronephrosis. Bladder is unremarkable. Stomach/Bowel: Stomach is within normal limits. Appendix appears normal. No evidence of bowel wall thickening, distention, or inflammatory changes. Vascular/Lymphatic: No significant vascular findings are present. No enlarged abdominal or pelvic lymph nodes. Reproductive: Uterus and bilateral adnexa are unremarkable. Other: Small amount of free pelvic fluid consistent with physiologic change noted. Musculoskeletal: Negative. IMPRESSION: Negative CT abdomen and pelvis. No finding to explain the patient's symptoms. Electronically Signed   By: Drusilla Kanner M.D.   On: 05/11/2019 23:34    Procedures Procedures (including critical care time)  Medications Ordered in ED Medications  sodium chloride 0.9 % bolus 1,000 mL (has no administration in time range)  morphine 4 MG/ML injection 4 mg (has no administration in time range)  droperidol (INAPSINE) 2.5 MG/ML injection 2.5 mg (has no administration in time range)     Initial Impression / Assessment and Plan / ED Course  I have reviewed the  triage vital signs and the nursing notes.  Pertinent labs & imaging results that were available during my care of the patient were reviewed by me and considered in my medical decision making (see chart for details).  Patient presents to the ER for the third time in 1 week with complaints of severe abdominal pain and vomiting.  She has had 2 prior visits during which CT scans were obtained.  Both of these were unremarkable.  Patient presents here with severe generalized abdominal pain and vomiting.  Her abdomen is tender across the upper abdomen, but is otherwise benign.  Patient given morphine, droperidol, and IV fluids and is now resting comfortably and feeling much better.  At this point, I see  nothing that appears emergent.  I see no indication to repeat her CT scan.  She has no white count, no fever, and laboratory studies are all basically unremarkable.  She will be discharged with GI follow-up and PRN return.  Final Clinical Impressions(s) / ED Diagnoses   Final diagnoses:  None    ED Discharge Orders    None       Geoffery Lyonselo, Madge Therrien, MD 05/13/19 [phone]

## 2019-05-12 NOTE — ED Notes (Signed)
Pt called out requesting Ice chips. RN provided a wet mouth swab.

## 2019-05-12 NOTE — ED Notes (Signed)
Went in to discharge pt  Pt doubled over in bed states her pain has returned  Pt is c/o pain across her lower abdomen mainly on the right side  EDPA notified

## 2019-05-12 NOTE — ED Notes (Signed)
ED Provider at bedside. 

## 2019-05-12 NOTE — ED Triage Notes (Signed)
Pt was anxious during triage. Pt did dry heave 2 times while RN was in the room. Upon exiting the room patient asked RN if she could have a ginger ale. RN told patient that she is not allowed anything by mouth since she is vomiting.

## 2019-05-13 LAB — COMPREHENSIVE METABOLIC PANEL
ALT: 14 U/L (ref 0–44)
AST: 16 U/L (ref 15–41)
Albumin: 5 g/dL (ref 3.5–5.0)
Alkaline Phosphatase: 52 U/L (ref 38–126)
Anion gap: 16 — ABNORMAL HIGH (ref 5–15)
BUN: 9 mg/dL (ref 6–20)
CO2: 18 mmol/L — ABNORMAL LOW (ref 22–32)
Calcium: 9.5 mg/dL (ref 8.9–10.3)
Chloride: 105 mmol/L (ref 98–111)
Creatinine, Ser: 0.81 mg/dL (ref 0.44–1.00)
GFR calc Af Amer: >60 mL/min (ref >60)
GFR calc non Af Amer: >60 mL/min (ref >60)
Glucose, Bld: 120 mg/dL — ABNORMAL HIGH (ref 70–99)
Potassium: 3.5 mmol/L (ref 3.5–5.1)
Sodium: 139 mmol/L (ref 135–145)
Total Bilirubin: 0.9 mg/dL (ref 0.3–1.2)
Total Protein: 8.8 g/dL — ABNORMAL HIGH (ref 6.5–8.1)

## 2019-05-13 LAB — URINALYSIS, MICROSCOPIC (REFLEX): RBC / HPF: >50 RBC/hpf (ref 0–5)

## 2019-05-13 LAB — URINALYSIS, ROUTINE W REFLEX MICROSCOPIC
Glucose, UA: NEGATIVE mg/dL
Ketones, ur: >80 mg/dL — AB
Leukocytes,Ua: NEGATIVE
Nitrite: NEGATIVE
Protein, ur: 100 mg/dL — AB
Specific Gravity, Urine: >1.03 — ABNORMAL HIGH (ref 1.005–1.030)
pH: 6 (ref 5.0–8.0)

## 2019-05-13 LAB — PREGNANCY, URINE: Preg Test, Ur: NEGATIVE

## 2019-05-13 LAB — LIPASE, BLOOD: Lipase: 27 U/L (ref 11–51)

## 2019-05-13 MED ORDER — ONDANSETRON 8 MG PO TBDP: ORAL_TABLET | ORAL | 0 refills | Status: DC

## 2019-05-13 MED ORDER — TRAMADOL HCL 50 MG PO TABS: 50.0000 mg | ORAL_TABLET | Freq: Four times a day (QID) | ORAL | 0 refills | Status: DC | PRN

## 2019-05-13 NOTE — ED Notes (Signed)
Pt understood dc material. NAD noted. Scripts given at dc. All questions answered to satisfaction. Pt escorted to check out counter. 

## 2019-05-13 NOTE — ED Notes (Signed)
ED Provider at bedside. 

## 2019-05-13 NOTE — Discharge Instructions (Addendum)
Zofran as prescribed as needed for nausea.  Tramadol as prescribed as needed for pain.  Follow-up with [facility] gastro[facility]ology if your symptoms or not improving in the next few days.  Their contact information has been provided in this discharge summary for you to call and make these arrangements.  Return to the emergency department in the meantime if you develop worsening pain, high fever, bloody stool, or other new and concerning symptoms.

## 2019-05-14 ENCOUNTER — Encounter (HOSPITAL_COMMUNITY): Payer: Self-pay

## 2019-05-14 ENCOUNTER — Emergency Department (HOSPITAL_COMMUNITY): Payer: Self-pay

## 2019-05-14 ENCOUNTER — Emergency Department (HOSPITAL_COMMUNITY)
Admission: EM | Admit: 2019-05-14 | Discharge: 2019-05-14 | Disposition: A | Discharge status: Home / Self Care | Payer: Self-pay | Attending: Emergency Medicine | Admitting: Emergency Medicine

## 2019-05-14 ENCOUNTER — Other Ambulatory Visit: Payer: Self-pay

## 2019-05-14 DIAGNOSIS — E049 Nontoxic goiter, unspecified: Secondary | ICD-10-CM

## 2019-05-14 DIAGNOSIS — R109 Unspecified abdominal pain: Secondary | ICD-10-CM | POA: Insufficient documentation

## 2019-05-14 DIAGNOSIS — F12988 Cannabis use, unspecified with other cannabis-induced disorder: Secondary | ICD-10-CM | POA: Insufficient documentation

## 2019-05-14 DIAGNOSIS — E876 Hypokalemia: Secondary | ICD-10-CM

## 2019-05-14 DIAGNOSIS — R112 Nausea with vomiting, unspecified: Secondary | ICD-10-CM

## 2019-05-14 DIAGNOSIS — J029 Acute pharyngitis, unspecified: Secondary | ICD-10-CM

## 2019-05-14 DIAGNOSIS — F1290 Cannabis use, unspecified, uncomplicated: Secondary | ICD-10-CM

## 2019-05-14 LAB — CBC
HCT: 39.3 % (ref 36.0–46.0)
Hemoglobin: 13.2 g/dL (ref 12.0–15.0)
MCH: 30.6 pg (ref 26.0–34.0)
MCHC: 33.6 g/dL (ref 30.0–36.0)
MCV: 91 fL (ref 80.0–100.0)
Platelets: 264 10*3/uL (ref 150–400)
RBC: 4.32 MIL/uL (ref 3.87–5.11)
RDW: 12.6 % (ref 11.5–15.5)
WBC: 6.9 10*3/uL (ref 4.0–10.5)
nRBC: 0 % (ref 0.0–0.2)

## 2019-05-14 LAB — COMPREHENSIVE METABOLIC PANEL
ALT: 12 U/L (ref 0–44)
AST: 18 U/L (ref 15–41)
Albumin: 4.3 g/dL (ref 3.5–5.0)
Alkaline Phosphatase: 46 U/L (ref 38–126)
Anion gap: 13 (ref 5–15)
BUN: 9 mg/dL (ref 6–20)
CO2: 27 mmol/L (ref 22–32)
Calcium: 9.3 mg/dL (ref 8.9–10.3)
Chloride: 97 mmol/L — ABNORMAL LOW (ref 98–111)
Creatinine, Ser: 0.94 mg/dL (ref 0.44–1.00)
GFR calc Af Amer: >60 mL/min (ref >60)
GFR calc non Af Amer: >60 mL/min (ref >60)
Glucose, Bld: 126 mg/dL — ABNORMAL HIGH (ref 70–99)
Potassium: 2.9 mmol/L — ABNORMAL LOW (ref 3.5–5.1)
Sodium: 137 mmol/L (ref 135–145)
Total Bilirubin: 1.1 mg/dL (ref 0.3–1.2)
Total Protein: 7.5 g/dL (ref 6.5–8.1)

## 2019-05-14 LAB — URINALYSIS, ROUTINE W REFLEX MICROSCOPIC
Bilirubin Urine: NEGATIVE
Glucose, UA: NEGATIVE mg/dL
Ketones, ur: 80 mg/dL — AB
Leukocytes,Ua: NEGATIVE
Nitrite: NEGATIVE
Protein, ur: 30 mg/dL — AB
Specific Gravity, Urine: 1.018 (ref 1.005–1.030)
pH: 8 (ref 5.0–8.0)

## 2019-05-14 LAB — RAPID URINE DRUG SCREEN, HOSP PERFORMED
Amphetamines: NOT DETECTED
Barbiturates: NOT DETECTED
Benzodiazepines: NOT DETECTED
Cocaine: NOT DETECTED
Opiates: POSITIVE — AB
Tetrahydrocannabinol: POSITIVE — AB

## 2019-05-14 LAB — LIPASE, BLOOD: Lipase: 22 U/L (ref 11–51)

## 2019-05-14 LAB — I-STAT BETA HCG BLOOD, ED (MC, WL, AP ONLY): I-stat hCG, quantitative: <5 m[IU]/mL (ref <5)

## 2019-05-14 LAB — GROUP A STREP BY PCR: Group A Strep by PCR: NOT DETECTED

## 2019-05-14 LAB — TSH: TSH: 0.526 u[IU]/mL (ref 0.350–4.500)

## 2019-05-14 MED ORDER — ONDANSETRON HCL 4 MG/2ML IJ SOLN
4.0000 mg | Freq: Once | INTRAMUSCULAR | Status: AC
  Administered 2019-05-14: 4 mg via INTRAVENOUS
  Filled 2019-05-14: qty 2

## 2019-05-14 MED ORDER — SODIUM CHLORIDE 0.9 % IV BOLUS
1000.0000 mL | Freq: Once | INTRAVENOUS | Status: AC
  Administered 2019-05-14: 1000 mL via INTRAVENOUS

## 2019-05-14 MED ORDER — HALOPERIDOL LACTATE 5 MG/ML IJ SOLN
5.0000 mg | Freq: Once | INTRAMUSCULAR | Status: AC
  Administered 2019-05-14: 5 mg via INTRAVENOUS
  Filled 2019-05-14: qty 1

## 2019-05-14 MED ORDER — PROMETHAZINE HCL 25 MG/ML IJ SOLN
25.0000 mg | Freq: Once | INTRAMUSCULAR | Status: AC
  Administered 2019-05-14: 25 mg via INTRAVENOUS
  Filled 2019-05-14: qty 1

## 2019-05-14 MED ORDER — POTASSIUM CHLORIDE CRYS ER 20 MEQ PO TBCR
40.0000 meq | EXTENDED_RELEASE_TABLET | Freq: Once | ORAL | Status: AC
  Administered 2019-05-14: 40 meq via ORAL
  Filled 2019-05-14: qty 2

## 2019-05-14 MED ORDER — METOCLOPRAMIDE HCL 10 MG PO TABS: 10.0000 mg | ORAL_TABLET | Freq: Four times a day (QID) | ORAL | 0 refills | Status: DC

## 2019-05-14 MED ORDER — DIPHENHYDRAMINE HCL 12.5 MG/5ML PO ELIX
12.5000 mg | ORAL_SOLUTION | Freq: Once | ORAL | Status: AC
  Administered 2019-05-14: 12.5 mg via ORAL
  Filled 2019-05-14: qty 10

## 2019-05-14 MED ORDER — MORPHINE SULFATE (PF) 4 MG/ML IV SOLN
4.0000 mg | Freq: Once | INTRAVENOUS | Status: AC
  Administered 2019-05-14: 4 mg via INTRAVENOUS
  Filled 2019-05-14: qty 1

## 2019-05-14 MED ORDER — DEXAMETHASONE SODIUM PHOSPHATE 10 MG/ML IJ SOLN
10.0000 mg | Freq: Once | INTRAMUSCULAR | Status: AC
  Administered 2019-05-14: 10 mg via INTRAVENOUS
  Filled 2019-05-14: qty 1

## 2019-05-14 MED ORDER — SODIUM CHLORIDE 0.9% FLUSH
3.0000 mL | Freq: Once | INTRAVENOUS | Status: AC
  Administered 2019-05-14: 3 mL via INTRAVENOUS

## 2019-05-14 NOTE — Discharge Instructions (Addendum)
Marijuana can lead to severe vomiting. Avoid all use. Take Reglan as prescribed. Follow up with your doctor tomorrow. Liquid diet, advance to bland diet as tolerated.

## 2019-05-14 NOTE — ED Notes (Signed)
Pt drank water for PO challenge without any difficulty.  Pt was able to keep water down

## 2019-05-14 NOTE — ED Provider Notes (Signed)
[facility] EMERGENCY DEPARTMENT Provider Note   CSN: [id number] Arrival date & time: 05/14/19  1129    History   Chief Complaint Chief Complaint  Patient presents with   Emesis   Abdominal Pain    HPI Gloria Gonzales is a 28 y.o. female.     28 year old female presents the emergency room with nausea, vomiting.  Patient states that she has had the symptoms for the past week, this is her fourth ER visit for same, has had lab work and CT scans x2 which have not found a source for her symptoms.  Patient states she is unable to follow-up with GI.  Patient states she is now having difficulty swallowing liquids or solids, states it feels like they get stuck and she vomits or regurgitates.  Patient also reports her throat is sore with swallowing her own secretions.  Patient also states her neck feels sore and swollen anteriorly.  Epigastric abdominal pain, does not radiate, nothing makes this pain better or worse.  Denies fevers, chills, history of thyroid disorder, shortness of breath or chest pain.  Patient is taking Zofran at home without improvement in her symptoms.  Last smoked marijuana 2 months ago.  No other complaints or concerns.     Past Medical History:  Diagnosis Date   Endometriosis     There are no active problems to display for this patient.   History reviewed. No pertinent surgical history.   OB History    Gravida  1   Para      Term      Preterm      AB      Living        SAB      TAB      Ectopic      Multiple      Live Births               Home Medications    Prior to Admission medications   Medication Sig Start Date End Date Taking? Authorizing Provider  metoCLOPramide (REGLAN) 10 MG tablet Take 1 tablet (10 mg total) by mouth every 6 (six) hours. 05/14/19   Jeannie Fend, PA-C  metroNIDAZOLE (FLAGYL) 500 MG tablet Take 1 tablet (500 mg total) by mouth 2 (two) times daily. 05/11/19   Carlyle Basques P, PA-C    Multiple Vitamin (MULTIVITAMIN WITH MINERALS) TABS tablet Take 1 tablet by mouth daily.    [provider]  naproxen (NAPROSYN) 500 MG tablet Take 1 tablet (500 mg total) by mouth 2 (two) times daily. 05/05/19   Arlyn Dunning, PA-C  ondansetron (ZOFRAN ODT) 8 MG disintegrating tablet  ODT q4 hours prn nausea 05/13/19   Geoffery Lyons, MD  ondansetron (ZOFRAN) 4 MG tablet Take 1 tablet (4 mg total) by mouth every 6 (six) hours. 05/05/19   Arlyn Dunning, PA-C  traMADol (ULTRAM) 50 MG tablet Take 1 tablet (50 mg total) by mouth every 6 (six) hours as needed. 05/13/19   Geoffery Lyons, MD    Family History No family history on file.  Social History Social History   Tobacco Use   Smoking status: Never Smoker   Smokeless tobacco: Never Used  Substance Use Topics   Alcohol use: Never    Frequency: Never   Drug use: Never     Allergies   Clonazepam   Review of Systems Review of Systems  Constitutional: Negative for chills, diaphoresis and fever.  HENT: Positive for sore throat,  trouble swallowing and voice change. Negative for congestion.   Respiratory: Negative for cough and shortness of breath.   Cardiovascular: Negative for chest pain.  Gastrointestinal: Positive for abdominal pain, nausea and vomiting. Negative for constipation and diarrhea.  Genitourinary: Negative for dysuria, frequency and urgency.  Musculoskeletal: Negative for arthralgias and myalgias.  Skin: Negative for rash and wound.  Allergic/Immunologic: Negative for immunocompromised state.  Neurological: Negative for dizziness and weakness.  Hematological: Negative for adenopathy.  All other systems reviewed and are negative.    Physical Exam Updated Vital Signs BP 121/70 (BP Location: Right Arm)    Pulse 83    Temp 98.1 F (36.7 C) (Oral)    Resp 16    Ht 5\' 6"  (1.676 m)    Wt 86.2 kg    SpO2 99%    BMI 30.67 kg/m   Physical Exam Vitals signs and nursing note reviewed.  Constitutional:       General: She is not in acute distress.    Appearance: She is well-developed. She is not diaphoretic.  HENT:     Head: Normocephalic and atraumatic.     Mouth/Throat:     Mouth: Mucous membranes are moist.     Pharynx: Uvula midline. Posterior oropharyngeal erythema present. No oropharyngeal exudate.     Tonsils: No tonsillar exudate or tonsillar abscesses. 2+ on the right. 2+ on the left.     Comments: Hoarse voice Neck:     Musculoskeletal: Muscular tenderness present.     Thyroid: Thyromegaly and thyroid tenderness present.     Comments: Thyroid enlarged and tender, no overlying erythema Cardiovascular:     Rate and Rhythm: Normal rate and regular rhythm.     Pulses: Normal pulses.     Heart sounds: Normal heart sounds.  Pulmonary:     Effort: Pulmonary effort is normal.     Breath sounds: Normal breath sounds.  Skin:    General: Skin is warm and dry.     Findings: No erythema or rash.  Neurological:     Mental Status: She is alert and oriented to person, place, and time.  Psychiatric:        Behavior: Behavior normal.      ED Treatments / Results  Labs (all labs ordered are listed, but only abnormal results are displayed) Labs Reviewed  COMPREHENSIVE METABOLIC PANEL - Abnormal; Notable for the following components:      Result Value   Potassium 2.9 (*)    Chloride 97 (*)    Glucose, Bld 126 (*)    All other components within normal limits  URINALYSIS, ROUTINE W REFLEX MICROSCOPIC - Abnormal; Notable for the following components:   Hgb urine dipstick MODERATE (*)    Ketones, ur 80 (*)    Protein, ur 30 (*)    Bacteria, UA RARE (*)    All other components within normal limits  RAPID URINE DRUG SCREEN, HOSP PERFORMED - Abnormal; Notable for the following components:   Opiates POSITIVE (*)    Tetrahydrocannabinol POSITIVE (*)    All other components within normal limits  GROUP A STREP BY PCR  LIPASE, BLOOD  CBC  TSH  I-STAT BETA HCG BLOOD, ED ([facility], [facility], [facility] ONLY)     EKG None  Radiology [place] Thyroid  Result Date: 05/14/2019 CLINICAL DATA:  Palpable abnormality. Goiter on physical examination EXAM: THYROID ULTRASOUND TECHNIQUE: Ultrasound examination of the thyroid gland and adjacent soft tissues was performed. COMPARISON:  None. FINDINGS: Parenchymal Echotexture: Mildly heterogenous Isthmus: Normal  in size measuring 0.4 cm in diameter Right lobe: Borderline enlarged measuring 5.8 x 2.0 x 2.7 cm Left lobe: Borderline enlarged measuring 5.1 x 2.0 x 1.7 cm _________________________________________________________ Estimated total number of nodules >/= 1 cm: 0 Number of spongiform nodules >/=  2 cm not described below (TR1): 0 Number of mixed cystic and solid nodules >/= 1.5 cm not described below (TR2): 0 _________________________________________________________ There is a solitary punctate (approximately 0.3 cm) anechoic cyst involving the anterior mid aspect the left lobe of the thyroid which does not meet imaging criteria to recommend percutaneous sampling or continued dedicated follow-up. IMPRESSION: Borderline enlarged and mildly heterogeneous appearing thyroid gland without worrisome thyroid nodule or mass. Electronically Signed   By: Simonne Come M.D.   On: 05/14/2019 15:10    Procedures Procedures (including critical care time)  Medications Ordered in ED Medications  sodium chloride flush (NS) 0.9 % injection 3 mL (3 mLs Intravenous Given 05/14/19 1241)  sodium chloride 0.9 % bolus 1,000 mL (0 mLs Intravenous Stopped 05/14/19 1522)  ondansetron (ZOFRAN) injection 4 mg (4 mg Intravenous Given 05/14/19 1239)  promethazine (PHENERGAN) injection 25 mg (25 mg Intravenous Given 05/14/19 1255)  dexamethasone (DECADRON) injection 10 mg (10 mg Intravenous Given 05/14/19 1254)  morphine 4 MG/ML injection 4 mg (4 mg Intravenous Given 05/14/19 1255)  potassium chloride SA (K-DUR) CR tablet 40 mEq (40 mEq Oral Given 05/14/19 1539)  haloperidol lactate (HALDOL) injection  5 mg (5 mg Intravenous Given 05/14/19 1539)  diphenhydrAMINE (BENADRYL) 12.5 MG/5ML elixir 12.5 mg (12.5 mg Oral Given 05/14/19 1538)     Initial Impression / Assessment and Plan / ED Course  I have reviewed the triage vital signs and the nursing notes.  Pertinent labs & imaging results that were available during my care of the patient were reviewed by me and considered in my medical decision making (see chart for details).  Clinical Course as of May 13 1917  Sun May 14, 2019  1226 28 year old female with complaint of ongoing vomiting.  Patient reports 4 ER visits in the past week for abdominal pain with nausea and vomiting.  Patient states that she has been unable to tolerate anything by mouth for the past 4 days, states that her throat feels sore, painful when she swallows secretions, feels like when she swallows liquids and solids they do not go all the way down and instead come back up, no history of similar previously also reports the front of her neck feels swollen and tender.  On exam patient has mildly enlarged thyroid which is tender to the touch without overlying erythema.  She has enlarged erythematous tonsils without exudate.  Patient was given IV Phenergan, decadron for her sore throat with enlarged tonsils, and fluids.  TSH is normal, lipase within normal limits, rapid strep negative, CBC within normal limits, CMP with mild hypokalemia with potassium of 2.9 which is orally repleted, hCG negative.  Patient has not provided a urine sample as of this time, considering possible cannabinoid hyperemesis although states last smoked marijuana 2 months ago.  Patient had 2 CT scans abdomen and pelvis without source for her discomfort found on prior ER visits.  Ultrasound of the thyroid shows mildly enlarged thyroid without concerning features.  Patient has not had any further emesis while in the emergency room.  Discussed results with patient, she is tearful but nothing has been found to explain her  symptoms and frustrated that she has been vomiting for so long. Patient reports initially having relief  with the phenergan but now has return of pain and nausea and requesting more pain medicine. Patient given Benadryl and Haldol, plan for PO challenge.    [LM]  1703 Patient tolerating PO fluids and potassium. Sleeping, easily wakes to name, states she is starting to get nauseous again, no vomiting for 5 hours in the ER so far.   [LM]  1807 Review of recorded vitals, O2 73%, patient is actually 99% on room air.   [LM]  1917 UDS positive for marijuana, suspect cannabinoid hyperemesis.  Advised patient to discontinue use.  Given prescription for Reglan.  Patient to follow-up with PCP.   [LM]    Clinical Course User Index [LM] Jeannie Fend, PA-C      Final Clinical Impressions(s) / ED Diagnoses   Final diagnoses:  Goiter  Hypokalemia  Pharyngitis, unspecified etiology  Non-intractable vomiting with nausea, unspecified vomiting type  Cannabinoid hyperemesis syndrome [facility])    ED Discharge Orders         Ordered    metoCLOPramide (REGLAN) 10 MG tablet  Every 6 hours     05/14/19 1848           Jeannie Fend, PA-C 05/14/19 1919    Terrilee Files, MD 05/16/19 [phone]

## 2019-05-14 NOTE — ED Notes (Signed)
Patient verbalizes understanding of discharge instructions. Opportunity for questioning and answers were provided. Armband removed by staff, pt discharged from ED.  

## 2019-05-14 NOTE — ED Triage Notes (Signed)
Pt reports emesis, abd pain for the past 4 days, pt seen here earlier this week for the same. Pt a.o, nad noted

## 2019-05-15 LAB — GC/CHLAMYDIA PROBE AMP (~~LOC~~) NOT AT ARMC
Chlamydia: NEGATIVE
Neisseria Gonorrhea: NEGATIVE

## 2019-08-06 ENCOUNTER — Emergency Department (HOSPITAL_BASED_OUTPATIENT_CLINIC_OR_DEPARTMENT_OTHER)
Admission: EM | Admit: 2019-08-06 | Discharge: 2019-08-07 | Disposition: A | Discharge status: Home / Self Care | Payer: 59 | Attending: Emergency Medicine | Admitting: Emergency Medicine

## 2019-08-06 ENCOUNTER — Other Ambulatory Visit: Payer: Self-pay

## 2019-08-06 DIAGNOSIS — F12988 Cannabis use, unspecified with other cannabis-induced disorder: Secondary | ICD-10-CM | POA: Insufficient documentation

## 2019-08-06 DIAGNOSIS — Z79899 Other long term (current) drug therapy: Secondary | ICD-10-CM | POA: Insufficient documentation

## 2019-08-06 DIAGNOSIS — R1110 Vomiting, unspecified: Secondary | ICD-10-CM | POA: Diagnosis present

## 2019-08-06 DIAGNOSIS — R112 Nausea with vomiting, unspecified: Secondary | ICD-10-CM

## 2019-08-06 LAB — COMPREHENSIVE METABOLIC PANEL
ALT: 34 U/L (ref 0–44)
AST: 40 U/L (ref 15–41)
Albumin: 4.6 g/dL (ref 3.5–5.0)
Alkaline Phosphatase: 62 U/L (ref 38–126)
Anion gap: 15 (ref 5–15)
BUN: 12 mg/dL (ref 6–20)
CO2: 29 mmol/L (ref 22–32)
Calcium: 9.6 mg/dL (ref 8.9–10.3)
Chloride: 92 mmol/L — ABNORMAL LOW (ref 98–111)
Creatinine, Ser: 0.88 mg/dL (ref 0.44–1.00)
GFR calc Af Amer: >60 mL/min (ref >60)
GFR calc non Af Amer: >60 mL/min (ref >60)
Glucose, Bld: 137 mg/dL — ABNORMAL HIGH (ref 70–99)
Potassium: 3 mmol/L — ABNORMAL LOW (ref 3.5–5.1)
Sodium: 136 mmol/L (ref 135–145)
Total Bilirubin: 1.4 mg/dL — ABNORMAL HIGH (ref 0.3–1.2)
Total Protein: 8.9 g/dL — ABNORMAL HIGH (ref 6.5–8.1)

## 2019-08-06 LAB — CBC
HCT: 43.2 % (ref 36.0–46.0)
Hemoglobin: 14.3 g/dL (ref 12.0–15.0)
MCH: 30.6 pg (ref 26.0–34.0)
MCHC: 33.1 g/dL (ref 30.0–36.0)
MCV: 92.3 fL (ref 80.0–100.0)
Platelets: 339 10*3/uL (ref 150–400)
RBC: 4.68 MIL/uL (ref 3.87–5.11)
RDW: 12.4 % (ref 11.5–15.5)
WBC: 9.4 10*3/uL (ref 4.0–10.5)
nRBC: 0 % (ref 0.0–0.2)

## 2019-08-06 LAB — LIPASE, BLOOD: Lipase: 25 U/L (ref 11–51)

## 2019-08-06 MED ORDER — PANTOPRAZOLE SODIUM 40 MG PO TBEC
40.0000 mg | DELAYED_RELEASE_TABLET | Freq: Once | ORAL | Status: AC
  Administered 2019-08-07: 40 mg via ORAL
  Filled 2019-08-06: qty 1

## 2019-08-06 MED ORDER — ONDANSETRON HCL 4 MG/2ML IJ SOLN
4.0000 mg | Freq: Once | INTRAMUSCULAR | Status: AC
  Administered 2019-08-07: 4 mg via INTRAVENOUS
  Filled 2019-08-06: qty 2

## 2019-08-06 NOTE — ED Triage Notes (Signed)
Pt presents with vomiting and burning to throat x 4 days. Denies abdominal pain/ diarrhea/ fevers.

## 2019-08-06 NOTE — ED Notes (Signed)
Pt unable to provide urine sample at this time 

## 2019-08-07 LAB — URINALYSIS, ROUTINE W REFLEX MICROSCOPIC
Bilirubin Urine: NEGATIVE
Glucose, UA: NEGATIVE mg/dL
Ketones, ur: NEGATIVE mg/dL
Leukocytes,Ua: NEGATIVE
Nitrite: NEGATIVE
Protein, ur: 30 mg/dL — AB
Specific Gravity, Urine: 1.015 (ref 1.005–1.030)
pH: 8.5 — ABNORMAL HIGH (ref 5.0–8.0)

## 2019-08-07 LAB — RAPID URINE DRUG SCREEN, HOSP PERFORMED
Amphetamines: NOT DETECTED
Barbiturates: NOT DETECTED
Benzodiazepines: NOT DETECTED
Cocaine: NOT DETECTED
Opiates: NOT DETECTED
Tetrahydrocannabinol: POSITIVE — AB

## 2019-08-07 LAB — URINALYSIS, MICROSCOPIC (REFLEX)

## 2019-08-07 LAB — GROUP A STREP BY PCR: Group A Strep by PCR: NOT DETECTED

## 2019-08-07 LAB — PREGNANCY, URINE: Preg Test, Ur: NEGATIVE

## 2019-08-07 MED ORDER — PROMETHAZINE HCL 25 MG PO TABS: 25.0000 mg | ORAL_TABLET | Freq: Four times a day (QID) | ORAL | 0 refills | Status: DC | PRN

## 2019-08-07 MED ORDER — HALOPERIDOL LACTATE 5 MG/ML IJ SOLN
2.0000 mg | Freq: Once | INTRAMUSCULAR | Status: AC
  Administered 2019-08-07: 2 mg via INTRAVENOUS
  Filled 2019-08-07: qty 1

## 2019-08-07 NOTE — Discharge Instructions (Addendum)
You were seen today for nausea and vomiting.  This is likely related to ongoing marijuana use.  Your symptoms likely improve when you choose to discontinue marijuana use.  You will be sent home with short course of Phenergan.  Make sure to stay hydrated.

## 2019-08-07 NOTE — ED Provider Notes (Signed)
[facility] EMERGENCY DEPARTMENT Provider Note   CSN: [id number] Arrival date & time: 08/06/19  2251    History   Chief Complaint Chief Complaint  Patient presents with   Emesis    HPI Gloria Gonzales is a 28 y.o. female.     HPI  This is a 28 year old female who presents with nausea, vomiting, sore throat.  Patient reports 4-day history of the symptoms.  She reports nonbilious, nonbloody emesis.  She states she is unable to keep anything down.  She denies any chest pain or abdominal pain.  She reports normal bowel movements.  She denies any fevers.  She states that the sore throat began after vomiting.  She has not taken anything for her symptoms.  She denies any sick contacts.  She reports a history of similar symptoms.  She does not believe she is pregnant and is currently on her menstrual period.  She denies any marijuana use or history of cyclic vomiting.  Past Medical History:  Diagnosis Date   Endometriosis     There are no active problems to display for this patient.   No past surgical history on file.   OB History    Gravida  1   Para      Term      Preterm      AB      Living        SAB      TAB      Ectopic      Multiple      Live Births               Home Medications    Prior to Admission medications   Medication Sig Start Date End Date Taking? Authorizing Provider  metoCLOPramide (REGLAN) 10 MG tablet Take 1 tablet (10 mg total) by mouth every 6 (six) hours. 05/14/19  Yes Jeannie FendMurphy, Laura A, PA-C  metroNIDAZOLE (FLAGYL) 500 MG tablet Take 1 tablet (500 mg total) by mouth 2 (two) times daily. 05/11/19  Yes Hernandez, Ana P, PA-C  Multiple Vitamin (MULTIVITAMIN WITH MINERALS) TABS tablet Take 1 tablet by mouth daily.   Yes [provider]  naproxen (NAPROSYN) 500 MG tablet Take 1 tablet (500 mg total) by mouth 2 (two) times daily. 05/05/19  Yes Ronnie DossMcLean, Kelly A, PA-C  ondansetron (ZOFRAN ODT) 8 MG disintegrating tablet 8mg   ODT q4 hours prn nausea 05/13/19  Yes Delo, Riley Lamouglas, MD  ondansetron (ZOFRAN) 4 MG tablet Take 1 tablet (4 mg total) by mouth every 6 (six) hours. 05/05/19  Yes Ronnie DossMcLean, Kelly A, PA-C  traMADol (ULTRAM) 50 MG tablet Take 1 tablet (50 mg total) by mouth every 6 (six) hours as needed. 05/13/19  Yes Delo, Riley Lamouglas, MD  promethazine (PHENERGAN) 25 MG tablet Take 1 tablet (25 mg total) by mouth every 6 (six) hours as needed for nausea or vomiting. 08/07/19   Amontae Ng, Mayer Maskerourtney F, MD    Family History No family history on file.  Social History Social History   Tobacco Use   Smoking status: Never Smoker   Smokeless tobacco: Never Used  Substance Use Topics   Alcohol use: Never    Frequency: Never   Drug use: Never     Allergies   Clonazepam   Review of Systems Review of Systems  Constitutional: Negative for fever.  Respiratory: Negative for shortness of breath.   Cardiovascular: Negative for chest pain.  Gastrointestinal: Positive for nausea and vomiting. Negative for abdominal pain, constipation and diarrhea.  Genitourinary: Negative for dysuria.  Neurological: Negative for dizziness.  All other systems reviewed and are negative.    Physical Exam Updated Vital Signs BP 136/81 (BP Location: Right Arm)    Pulse 62    Temp 98.4 F (36.9 C) (Oral)    Resp 17    Ht 1.676 m (5\' 6" )    Wt 82.6 kg    LMP 08/06/2019    SpO2 99%    BMI 29.38 kg/m   Physical Exam Vitals signs and nursing note reviewed.  Constitutional:      Appearance: She is well-developed. She is not ill-appearing.  HENT:     Head: Normocephalic and atraumatic.     Mouth/Throat:     Mouth: Mucous membranes are moist.     Pharynx: No posterior oropharyngeal erythema.     Comments: 2+ bilateral tonsillar enlargement, no exudate noted Eyes:     Pupils: Pupils are equal, round, and reactive to light.  Neck:     Musculoskeletal: Neck supple.  Cardiovascular:     Rate and Rhythm: Normal rate and regular rhythm.      Heart sounds: Normal heart sounds.  Pulmonary:     Effort: Pulmonary effort is normal. No respiratory distress.     Breath sounds: No wheezing.  Abdominal:     General: Bowel sounds are normal.     Palpations: Abdomen is soft.     Tenderness: There is no abdominal tenderness. There is no guarding or rebound.  Skin:    General: Skin is warm and dry.  Neurological:     Mental Status: She is alert and oriented to person, place, and time.  Psychiatric:        Mood and Affect: Mood normal.      ED Treatments / Results  Labs (all labs ordered are listed, but only abnormal results are displayed) Labs Reviewed  COMPREHENSIVE METABOLIC PANEL - Abnormal; Notable for the following components:      Result Value   Potassium 3.0 (*)    Chloride 92 (*)    Glucose, Bld 137 (*)    Total Protein 8.9 (*)    Total Bilirubin 1.4 (*)    All other components within normal limits  URINALYSIS, ROUTINE W REFLEX MICROSCOPIC - Abnormal; Notable for the following components:   pH 8.5 (*)    Hgb urine dipstick LARGE (*)    Protein, ur 30 (*)    All other components within normal limits  RAPID URINE DRUG SCREEN, HOSP PERFORMED - Abnormal; Notable for the following components:   Tetrahydrocannabinol POSITIVE (*)    All other components within normal limits  URINALYSIS, MICROSCOPIC (REFLEX) - Abnormal; Notable for the following components:   Bacteria, UA FEW (*)    All other components within normal limits  GROUP A STREP BY PCR  LIPASE, BLOOD  CBC  PREGNANCY, URINE    EKG None  Radiology No results found.  Procedures Procedures (including critical care time)  Medications Ordered in ED Medications  ondansetron (ZOFRAN) injection 4 mg (4 mg Intravenous Given 08/07/19 0009)  pantoprazole (PROTONIX) EC tablet 40 mg (40 mg Oral Given 08/07/19 0009)  haloperidol lactate (HALDOL) injection 2 mg (2 mg Intravenous Given 08/07/19 0142)     Initial Impression / Assessment and Plan / ED Course  I  have reviewed the triage vital signs and the nursing notes.  Pertinent labs & imaging results that were available during my care of the patient were reviewed by me and considered in my  medical decision making (see chart for details).        Patient presents with nausea and vomiting.  She is overall nontoxic-appearing.  She appears well-hydrated.  Vital signs are reassuring.  She denies any abdominal pain.  Reports normal bowel movements.  History of similar symptoms in May.  At that time thought to be related to ongoing marijuana use.  She denies this to me today.  Work-up initiated.  Patient was given fluids and nausea medication.  Lab work-up is largely reassuring.  Patient initially declined to provide a urine.  I discussed with her that I needed a urine to ensure that she was not pregnant.  She ultimately provided a urine.  She is again positive for marijuana.  I discussed with her that she has not been forthcoming.  She states that she thinks that she ate marijuana brownies.  I discussed with her that despite how she had marijuana in her symptom this was likely what was causing her symptoms.  Given her history, if she continues to choose to smoke marijuana, she will likely have recurrence of her symptoms.  Patient stated understanding.  While she states she feels nauseous with eating, she has not had any active vomiting.  Will discharge home with Phenergan.  After history, exam, and medical workup I feel the patient has been appropriately medically screened and is safe for discharge home. Pertinent diagnoses were discussed with the patient. Patient was given return precautions.   Final Clinical Impressions(s) / ED Diagnoses   Final diagnoses:  Cannabinoid hyperemesis syndrome [facility])    ED Discharge Orders         Ordered    promethazine (PHENERGAN) 25 MG tablet  Every 6 hours PRN     08/07/19 0155           Merryl Hacker, MD 08/07/19 0157

## 2019-11-16 ENCOUNTER — Other Ambulatory Visit: Payer: Self-pay

## 2019-11-16 ENCOUNTER — Emergency Department (HOSPITAL_COMMUNITY)
Admission: EM | Admit: 2019-11-16 | Discharge: 2019-11-16 | Disposition: A | Discharge status: Home / Self Care | Payer: 59 | Attending: Emergency Medicine | Admitting: Emergency Medicine

## 2019-11-16 ENCOUNTER — Encounter (HOSPITAL_COMMUNITY): Payer: Self-pay

## 2019-11-16 DIAGNOSIS — R1010 Upper abdominal pain, unspecified: Secondary | ICD-10-CM | POA: Diagnosis not present

## 2019-11-16 DIAGNOSIS — R112 Nausea with vomiting, unspecified: Secondary | ICD-10-CM | POA: Diagnosis present

## 2019-11-16 DIAGNOSIS — Z79899 Other long term (current) drug therapy: Secondary | ICD-10-CM | POA: Insufficient documentation

## 2019-11-16 DIAGNOSIS — R300 Dysuria: Secondary | ICD-10-CM | POA: Diagnosis not present

## 2019-11-16 LAB — CBC WITH DIFFERENTIAL/PLATELET
Abs Immature Granulocytes: 0.04 10*3/uL (ref 0.00–0.07)
Basophils Absolute: 0 10*3/uL (ref 0.0–0.1)
Basophils Relative: 0 %
Eosinophils Absolute: 0 10*3/uL (ref 0.0–0.5)
Eosinophils Relative: 0 %
HCT: 42.1 % (ref 36.0–46.0)
Hemoglobin: 14.3 g/dL (ref 12.0–15.0)
Immature Granulocytes: 0 %
Lymphocytes Relative: 19 %
Lymphs Abs: 1.7 10*3/uL (ref 0.7–4.0)
MCH: 31.1 pg (ref 26.0–34.0)
MCHC: 34 g/dL (ref 30.0–36.0)
MCV: 91.5 fL (ref 80.0–100.0)
Monocytes Absolute: 0.5 10*3/uL (ref 0.1–1.0)
Monocytes Relative: 6 %
Neutro Abs: 6.8 10*3/uL (ref 1.7–7.7)
Neutrophils Relative %: 75 %
Platelets: 264 10*3/uL (ref 150–400)
RBC: 4.6 MIL/uL (ref 3.87–5.11)
RDW: 12.8 % (ref 11.5–15.5)
WBC: 9.2 10*3/uL (ref 4.0–10.5)
nRBC: 0 % (ref 0.0–0.2)

## 2019-11-16 LAB — URINALYSIS, COMPLETE (UACMP) WITH MICROSCOPIC
Bacteria, UA: NONE SEEN
Bilirubin Urine: NEGATIVE
Glucose, UA: NEGATIVE mg/dL
Ketones, ur: 80 mg/dL — AB
Leukocytes,Ua: NEGATIVE
Nitrite: NEGATIVE
Protein, ur: 100 mg/dL — AB
RBC / HPF: >50 RBC/hpf — ABNORMAL HIGH (ref 0–5)
Specific Gravity, Urine: 1.025 (ref 1.005–1.030)
pH: 9 — ABNORMAL HIGH (ref 5.0–8.0)

## 2019-11-16 LAB — COMPREHENSIVE METABOLIC PANEL
ALT: 30 U/L (ref 0–44)
AST: 43 U/L — ABNORMAL HIGH (ref 15–41)
Albumin: 4.5 g/dL (ref 3.5–5.0)
Alkaline Phosphatase: 51 U/L (ref 38–126)
Anion gap: 17 — ABNORMAL HIGH (ref 5–15)
BUN: 12 mg/dL (ref 6–20)
CO2: 27 mmol/L (ref 22–32)
Calcium: 9.8 mg/dL (ref 8.9–10.3)
Chloride: 95 mmol/L — ABNORMAL LOW (ref 98–111)
Creatinine, Ser: 0.88 mg/dL (ref 0.44–1.00)
GFR calc Af Amer: >60 mL/min (ref >60)
GFR calc non Af Amer: >60 mL/min (ref >60)
Glucose, Bld: 120 mg/dL — ABNORMAL HIGH (ref 70–99)
Potassium: 3.7 mmol/L (ref 3.5–5.1)
Sodium: 139 mmol/L (ref 135–145)
Total Bilirubin: 1.7 mg/dL — ABNORMAL HIGH (ref 0.3–1.2)
Total Protein: 7.9 g/dL (ref 6.5–8.1)

## 2019-11-16 LAB — I-STAT BETA HCG BLOOD, ED (MC, WL, AP ONLY): I-stat hCG, quantitative: <5 m[IU]/mL (ref <5)

## 2019-11-16 LAB — LIPASE, BLOOD: Lipase: 23 U/L (ref 11–51)

## 2019-11-16 MED ORDER — PROMETHAZINE HCL 25 MG/ML IJ SOLN
12.5000 mg | Freq: Once | INTRAMUSCULAR | Status: AC
  Administered 2019-11-16: 09:00:00 12.5 mg via INTRAVENOUS
  Filled 2019-11-16: qty 1

## 2019-11-16 MED ORDER — ALUM & MAG HYDROXIDE-SIMETH 200-200-20 MG/5ML PO SUSP
30.0000 mL | Freq: Once | ORAL | Status: AC
  Administered 2019-11-16: 30 mL via ORAL
  Filled 2019-11-16: qty 30

## 2019-11-16 MED ORDER — PROMETHAZINE HCL 25 MG PO TABS: 25.0000 mg | ORAL_TABLET | Freq: Four times a day (QID) | ORAL | 0 refills | Status: DC | PRN

## 2019-11-16 MED ORDER — SODIUM CHLORIDE 0.9 % IV BOLUS (SEPSIS)
1000.0000 mL | Freq: Once | INTRAVENOUS | Status: AC
  Administered 2019-11-16: 07:00:00 1000 mL via INTRAVENOUS

## 2019-11-16 MED ORDER — ONDANSETRON HCL 4 MG/2ML IJ SOLN
4.0000 mg | Freq: Once | INTRAMUSCULAR | Status: AC
  Administered 2019-11-16: 07:00:00 4 mg via INTRAVENOUS
  Filled 2019-11-16: qty 2

## 2019-11-16 MED ORDER — LIDOCAINE VISCOUS HCL 2 % MT SOLN
15.0000 mL | Freq: Once | OROMUCOSAL | Status: AC
  Administered 2019-11-16: 07:00:00 15 mL via ORAL
  Filled 2019-11-16: qty 15

## 2019-11-16 MED ORDER — OMEPRAZOLE 20 MG PO CPDR: 20.0000 mg | DELAYED_RELEASE_CAPSULE | Freq: Every day | ORAL | 0 refills | Status: DC

## 2019-11-16 MED ORDER — POTASSIUM CHLORIDE CRYS ER 20 MEQ PO TBCR
40.0000 meq | EXTENDED_RELEASE_TABLET | Freq: Once | ORAL | Status: AC
  Administered 2019-11-16: 40 meq via ORAL
  Filled 2019-11-16: qty 2

## 2019-11-16 NOTE — ED Provider Notes (Signed)
[facility] EMERGENCY DEPARTMENT Provider Note   CSN: [id number] Arrival date & time: 11/16/19  0605     History   Chief Complaint Chief Complaint  Patient presents with   Emesis    HPI Gloria Gonzales is a 28 y.o. female.     Patient is a 28 year old female with past history of endometriosis presenting to the emergency department for abdominal pain, nausea, vomiting.  Patient reports that this is been going on for about 3 days.  She reports that this happens every month when she gets her period and she started her menstrual cycle 3 days ago.  She reports that she is having upper abdominal burning with nausea and vomiting.  Denies any cough, fever, URI symptoms, diarrhea, vaginal discharge.  Reports that she is having some dysuria but no hematuria.  She reports that she has some Zofran at home which she is taking but she has had about 10 episodes of nonbloody vomiting and has not been eating and drinking much.  She was seen at [facility] just a couple of days ago for the same and was given a prescription for Zofran     Past Medical History:  Diagnosis Date   Endometriosis     There are no active problems to display for this patient.   No past surgical history on file.   OB History    Gravida  1   Para      Term      Preterm      AB      Living        SAB      TAB      Ectopic      Multiple      Live Births               Home Medications    Prior to Admission medications   Medication Sig Start Date End Date Taking? Authorizing Provider  metoCLOPramide (REGLAN) 10 MG tablet Take 1 tablet (10 mg total) by mouth every 6 (six) hours. 05/14/19   Jeannie Fend, PA-C  metroNIDAZOLE (FLAGYL) 500 MG tablet Take 1 tablet (500 mg total) by mouth 2 (two) times daily. 05/11/19   Carlyle Basques P, PA-C  Multiple Vitamin (MULTIVITAMIN WITH MINERALS) TABS tablet Take 1 tablet by mouth daily.    [provider]  naproxen (NAPROSYN) 500  MG tablet Take 1 tablet (500 mg total) by mouth 2 (two) times daily. 05/05/19   Arlyn Dunning, PA-C  ondansetron (ZOFRAN ODT) 8 MG disintegrating tablet 8mg  ODT q4 hours prn nausea 05/13/19   05/15/19, MD  ondansetron (ZOFRAN) 4 MG tablet Take 1 tablet (4 mg total) by mouth every 6 (six) hours. 05/05/19   07/05/19, PA-C  promethazine (PHENERGAN) 25 MG tablet Take 1 tablet (25 mg total) by mouth every 6 (six) hours as needed for nausea or vomiting. 08/07/19   Horton, 10/07/19, MD  traMADol (ULTRAM) 50 MG tablet Take 1 tablet (50 mg total) by mouth every 6 (six) hours as needed. 05/13/19   05/15/19, MD    Family History No family history on file.  Social History Social History   Tobacco Use   Smoking status: Never Smoker   Smokeless tobacco: Never Used  Substance Use Topics   Alcohol use: Never    Frequency: Never   Drug use: Never     Allergies   Clonazepam   Review of Systems Review of Systems  Constitutional: Positive for appetite change. Negative for activity change, chills, fatigue and fever.  HENT: Negative for congestion and sore throat.   Respiratory: Negative for cough and shortness of breath.   Cardiovascular: Negative for chest pain.  Gastrointestinal: Positive for abdominal pain, nausea and vomiting. Negative for abdominal distention, anal bleeding, blood in stool, constipation, diarrhea and rectal pain.  Genitourinary: Positive for dysuria and vaginal bleeding (normal menstrual cycle). Negative for decreased urine volume, difficulty urinating, hematuria, menstrual problem, pelvic pain, urgency, vaginal discharge and vaginal pain.  Musculoskeletal: Negative for back pain.  Skin: Negative for rash.  Neurological: Negative for headaches.  Psychiatric/Behavioral: Negative for confusion.     Physical Exam Updated Vital Signs BP (!) 143/87 (BP Location: Left Arm)    Pulse 75    Temp 98.2 F (36.8 C) (Oral)    Resp 16    Ht 5\' 6"  (1.676 m)    Wt 83.5  kg    SpO2 100%    BMI 29.70 kg/m   Physical Exam   ED Treatments / Results  Labs (all labs ordered are listed, but only abnormal results are displayed) Labs Reviewed  CBC WITH DIFFERENTIAL/PLATELET  COMPREHENSIVE METABOLIC PANEL  LIPASE, BLOOD  URINALYSIS, ROUTINE W REFLEX MICROSCOPIC  URINALYSIS, COMPLETE (UACMP) WITH MICROSCOPIC  I-STAT BETA HCG BLOOD, ED ([facility], [facility], [facility] ONLY)    EKG None  Radiology No results found.  Procedures Procedures (including critical care time)  Medications Ordered in ED Medications  ondansetron (ZOFRAN) injection 4 mg (has no administration in time range)  sodium chloride 0.9 % bolus 1,000 mL (has no administration in time range)  alum & mag hydroxide-simeth (MAALOX/MYLANTA) 200-200-20 MG/5ML suspension 30 mL (has no administration in time range)    And  lidocaine (XYLOCAINE) 2 % viscous mouth solution 15 mL (has no administration in time range)     Initial Impression / Assessment and Plan / ED Course  I have reviewed the triage vital signs and the nursing notes.  Pertinent labs & imaging results that were available during my care of the patient were reviewed by me and considered in my medical decision making (see chart for details).  Clinical Course as of Nov 15 916  Thu Nov 16, 2019  0756 Patient presenting to the emergency department for nausea and vomiting over the last 2 to 3 days.  Reports multiple histories of the same related to her menstrual cycle.  Patient has not had any vomiting here in the emergency department and she is improved after GI cocktail and IV Zofran.  She is not having any abdominal pain at this time.  Pregnancy negative.  No signs of urinary tract infection.  She does have mildly elevated anion gap which is probably related to her vomiting but she has not had any vomiting here in the emergency department.  I reassessed her multiple times and she appears nontoxic without vomiting or abdominal pain.  We will give her p.o.  challenge and let her finish her liter of fluids.  I am also going to give her a one time dose of oral potassium. Or recorded potassium was 3.7 but the specimen was hemolyzed so I feel this is falsely high. She has had hx of hypokalemia in the past. If she continues to improve she can be discharged home.   [KM]  [phone] Patient continues to improve. Tolerating PO. Will d/c home with Rx for phenergan as she says zofran does not help much. Also will give her omeprazole as there  seems to be some component of GERD as well.    [KM]    Clinical Course User Index [KM] Arlyn DunningMcLean, Luwanda Starr A, PA-C       Based on review of vitals, medical screening exam, lab work and/or imaging, there does not appear to be an acute, emergent etiology for the patient's symptoms. Counseled pt on good return precautions and encouraged both PCP and ED follow-up as needed.  Prior to discharge, I also discussed incidental imaging findings with patient in detail and advised appropriate, recommended follow-up in detail.  Clinical Impression: 1. Non-intractable vomiting with nausea, unspecified vomiting type     Disposition: Discharge  Prior to providing a prescription for a controlled substance, I independently reviewed the patient's recent prescription history on the [place] Controlled Substance Reporting System. The patient had no recent or regular prescriptions and was deemed appropriate for a brief, less than 3 day prescription of narcotic for acute analgesia.  This note was prepared with assistance of Conservation officer, historic buildingsDragon voice recognition software. Occasional wrong-word or sound-a-like substitutions may have occurred due to the inherent limitations of voice recognition software.   Final Clinical Impressions(s) / ED Diagnoses   Final diagnoses:  None    ED Discharge Orders    None       Jeral PinchMcLean, Renatha Rosen A, PA-C 11/16/19 [id number]    Little, Ambrose Finlandachel Morgan, MD 11/16/19 1017

## 2019-11-16 NOTE — ED Triage Notes (Signed)
Pt brought to ED from home via [facility]. Pt c/o n/v for 2 days. Pt was seen at [facility] 2 days ago and was diagnosed with a stomach virus. Pt did not pickup zofran RX. Pt reports approx 10 episodes of emesis in the last day with  burning sensation in abdomen and throat.

## 2019-11-16 NOTE — Discharge Instructions (Addendum)
Please make sure you are drinking lots of fluids. Take the nausea medication as needed. Take the omeprazole every day to help prevent further symptoms. Thank you for allowing me to care for you today. Please return to the emergency department if you have new or worsening symptoms.

## 2020-04-20 ENCOUNTER — Other Ambulatory Visit: Payer: Self-pay

## 2020-04-20 ENCOUNTER — Encounter (HOSPITAL_BASED_OUTPATIENT_CLINIC_OR_DEPARTMENT_OTHER): Payer: Self-pay | Admitting: Emergency Medicine

## 2020-04-20 ENCOUNTER — Emergency Department (HOSPITAL_BASED_OUTPATIENT_CLINIC_OR_DEPARTMENT_OTHER)
Admission: EM | Admit: 2020-04-20 | Discharge: 2020-04-20 | Disposition: A | Discharge status: Home / Self Care | Payer: 59 | Attending: Emergency Medicine | Admitting: Emergency Medicine

## 2020-04-20 ENCOUNTER — Emergency Department (HOSPITAL_BASED_OUTPATIENT_CLINIC_OR_DEPARTMENT_OTHER): Payer: 59

## 2020-04-20 DIAGNOSIS — N946 Dysmenorrhea, unspecified: Secondary | ICD-10-CM

## 2020-04-20 DIAGNOSIS — Z79899 Other long term (current) drug therapy: Secondary | ICD-10-CM | POA: Insufficient documentation

## 2020-04-20 DIAGNOSIS — R112 Nausea with vomiting, unspecified: Secondary | ICD-10-CM | POA: Insufficient documentation

## 2020-04-20 DIAGNOSIS — N9489 Other specified conditions associated with female genital organs and menstrual cycle: Secondary | ICD-10-CM | POA: Insufficient documentation

## 2020-04-20 LAB — COMPREHENSIVE METABOLIC PANEL
ALT: 26 U/L (ref 0–44)
AST: 24 U/L (ref 15–41)
Albumin: 4.7 g/dL (ref 3.5–5.0)
Alkaline Phosphatase: 54 U/L (ref 38–126)
Anion gap: 15 (ref 5–15)
BUN: 12 mg/dL (ref 6–20)
CO2: 26 mmol/L (ref 22–32)
Calcium: 9.8 mg/dL (ref 8.9–10.3)
Chloride: 96 mmol/L — ABNORMAL LOW (ref 98–111)
Creatinine, Ser: 0.96 mg/dL (ref 0.44–1.00)
GFR calc Af Amer: >60 mL/min (ref >60)
GFR calc non Af Amer: >60 mL/min (ref >60)
Glucose, Bld: 127 mg/dL — ABNORMAL HIGH (ref 70–99)
Potassium: 3.6 mmol/L (ref 3.5–5.1)
Sodium: 137 mmol/L (ref 135–145)
Total Bilirubin: 1.5 mg/dL — ABNORMAL HIGH (ref 0.3–1.2)
Total Protein: 8.8 g/dL — ABNORMAL HIGH (ref 6.5–8.1)

## 2020-04-20 LAB — HCG, SERUM, QUALITATIVE: Preg, Serum: NEGATIVE

## 2020-04-20 LAB — CBC WITH DIFFERENTIAL/PLATELET
Abs Immature Granulocytes: 0.02 10*3/uL (ref 0.00–0.07)
Basophils Absolute: 0 10*3/uL (ref 0.0–0.1)
Basophils Relative: 0 %
Eosinophils Absolute: 0 10*3/uL (ref 0.0–0.5)
Eosinophils Relative: 0 %
HCT: 44.7 % (ref 36.0–46.0)
Hemoglobin: 15.4 g/dL — ABNORMAL HIGH (ref 12.0–15.0)
Immature Granulocytes: 0 %
Lymphocytes Relative: 31 %
Lymphs Abs: 2.5 10*3/uL (ref 0.7–4.0)
MCH: 31.6 pg (ref 26.0–34.0)
MCHC: 34.5 g/dL (ref 30.0–36.0)
MCV: 91.6 fL (ref 80.0–100.0)
Monocytes Absolute: 0.5 10*3/uL (ref 0.1–1.0)
Monocytes Relative: 6 %
Neutro Abs: 5 10*3/uL (ref 1.7–7.7)
Neutrophils Relative %: 63 %
Platelets: 318 10*3/uL (ref 150–400)
RBC: 4.88 MIL/uL (ref 3.87–5.11)
RDW: 12.2 % (ref 11.5–15.5)
WBC: 8.1 10*3/uL (ref 4.0–10.5)
nRBC: 0 % (ref 0.0–0.2)

## 2020-04-20 LAB — LIPASE, BLOOD: Lipase: 24 U/L (ref 11–51)

## 2020-04-20 MED ORDER — ONDANSETRON HCL 4 MG/2ML IJ SOLN
INTRAMUSCULAR | Status: AC
  Administered 2020-04-20: 8 mg
  Filled 2020-04-20: qty 4

## 2020-04-20 MED ORDER — KETOROLAC TROMETHAMINE 30 MG/ML IJ SOLN
30.0000 mg | Freq: Once | INTRAMUSCULAR | Status: AC
  Administered 2020-04-20: 30 mg via INTRAVENOUS
  Filled 2020-04-20: qty 1

## 2020-04-20 MED ORDER — IBUPROFEN 600 MG PO TABS: 600.0000 mg | ORAL_TABLET | Freq: Four times a day (QID) | ORAL | 0 refills | Status: DC | PRN

## 2020-04-20 MED ORDER — SODIUM CHLORIDE 0.9 % IV BOLUS
1000.0000 mL | Freq: Once | INTRAVENOUS | Status: AC
  Administered 2020-04-20: 1000 mL via INTRAVENOUS

## 2020-04-20 MED ORDER — ONDANSETRON HCL 4 MG PO TABS: 4.0000 mg | ORAL_TABLET | Freq: Four times a day (QID) | ORAL | 0 refills | Status: DC

## 2020-04-20 MED ORDER — SODIUM CHLORIDE 0.9 % IV SOLN
8.0000 mg | Freq: Once | INTRAVENOUS | Status: DC
  Filled 2020-04-20: qty 4

## 2020-04-20 MED ORDER — LORAZEPAM 2 MG/ML IJ SOLN
0.5000 mg | Freq: Once | INTRAMUSCULAR | Status: AC
  Administered 2020-04-20: 0.5 mg via INTRAVENOUS
  Filled 2020-04-20: qty 1

## 2020-04-20 NOTE — Discharge Instructions (Signed)
You may alternate taking Tylenol and Ibuprofen as needed for pain control. You may take 400-600 mg of ibuprofen every 6 hours and 500-1000 mg of Tylenol every 6 hours. Do not exceed 4000 mg of Tylenol daily as this can lead to liver damage. Also, make sure to take Ibuprofen with meals as it can cause an upset stomach. Do not take other NSAIDs while taking Ibuprofen such as (Aleve, Naprosyn, Aspirin, Celebrex, etc) and do not take more than the prescribed dose as this can lead to ulcers and bleeding in your GI tract. You may use warm and cold compresses to help with your symptoms.   You were given a prescription for zofran to help with your nausea. Please take as directed.  Please follow up with your primary doctor within the next 7-10 days for re-evaluation and further treatment of your symptoms.   Please return to the ER sooner if you have any new or worsening symptoms.  

## 2020-04-20 NOTE — ED Triage Notes (Addendum)
Pt c/o sob and "feeling bad" x 4 days. Pt also c/o lower abdominal pain. pt endorses menstruating, loss of appetite. Pt denies fever, tearful in triage. Endorses receiving 2nd covid vaccine 4/14. Pt also reports intermittent CP, no pain at time of triage

## 2020-04-20 NOTE — ED Notes (Signed)
Pt on monitor 

## 2020-04-20 NOTE — ED Provider Notes (Signed)
[facility] EMERGENCY DEPARTMENT Provider Note   CSN: [id number] Arrival date & time: 04/20/20  1128     History Chief Complaint  Patient presents with  . Vomiting    Gloria Gonzales is a 29 y.o. female.  HPI   Pt is a 29 y/o female with a h/o endometriosis who presents to the ED for eval of NV and abd pain. States she is currently on her menstrual cycle and has been having nausea and vomiting since it started 4 days ago. She also reports some lower abd cramps that are consistent with her prior menstrual cramps.  She also reports constipation. Last BM was 2 days ago. Denies fevers, diarrhea, cough. States she feels short of breath and fatigued. She states she had some burning to her chest earlier after vomiting. She does not have this pain currently.   She typically does have these symptoms associated with her menstrual cycle.   Past Medical History:  Diagnosis Date  . Endometriosis     There are no problems to display for this patient.   History reviewed. No pertinent surgical history.   OB History    Gravida  1   Para      Term      Preterm      AB      Living        SAB      TAB      Ectopic      Multiple      Live Births              History reviewed. No pertinent family history.  Social History   Tobacco Use  . Smoking status: Never Smoker  . Smokeless tobacco: Never Used  Substance Use Topics  . Alcohol use: Never  . Drug use: Not Currently    Types: Marijuana    Home Medications Prior to Admission medications   Medication Sig Start Date End Date Taking? Authorizing Provider  ibuprofen (ADVIL) 600 MG tablet Take 1 tablet (600 mg total) by mouth every 6 (six) hours as needed. 04/20/20   Nickalous Stingley S, PA-C  metoCLOPramide (REGLAN) 10 MG tablet Take 1 tablet (10 mg total) by mouth every 6 (six) hours. 05/14/19   Tacy Learn, PA-C  metroNIDAZOLE (FLAGYL) 500 MG tablet Take 1 tablet (500 mg total) by mouth 2 (two) times  daily. 05/11/19   Darlin Drop P, PA-C  Multiple Vitamin (MULTIVITAMIN WITH MINERALS) TABS tablet Take 1 tablet by mouth daily.    [provider]  naproxen (NAPROSYN) 500 MG tablet Take 1 tablet (500 mg total) by mouth 2 (two) times daily. 05/05/19   Alveria Apley, PA-C  omeprazole (PRILOSEC) 20 MG capsule Take 1 capsule (20 mg total) by mouth daily. 11/16/19 12/16/19  Madilyn Hook A, PA-C  ondansetron (ZOFRAN) 4 MG tablet Take 1 tablet (4 mg total) by mouth every 6 (six) hours. 04/20/20   Chanoch Mccleery S, PA-C  promethazine (PHENERGAN) 25 MG tablet Take 1 tablet (25 mg total) by mouth every 6 (six) hours as needed for up to 3 days for nausea or vomiting. 11/16/19 11/19/19  Alveria Apley, PA-C  traMADol (ULTRAM) 50 MG tablet Take 1 tablet (50 mg total) by mouth every 6 (six) hours as needed. 05/13/19   Veryl Speak, MD    Allergies    Clonazepam  Review of Systems   Review of Systems  Constitutional: Negative for fever.  HENT: Negative for ear pain and  sore throat.   Eyes: Negative for visual disturbance.  Respiratory: Positive for shortness of breath. Negative for cough.   Cardiovascular: Negative for chest pain.  Gastrointestinal: Positive for abdominal pain, constipation, nausea and vomiting. Negative for diarrhea.  Genitourinary: Positive for vaginal bleeding (menses). Negative for dysuria and hematuria.  Musculoskeletal: Negative for back pain.  Skin: Negative for rash.  Neurological: Negative for headaches.  All other systems reviewed and are negative.   Physical Exam Updated Vital Signs BP 107/77   Pulse 68   Temp 98.7 F (37.1 C) (Oral)   Resp 12   LMP 04/17/2020   SpO2 100%   Physical Exam Vitals and nursing note reviewed.  Constitutional:      General: She is not in acute distress.    Appearance: She is well-developed.  HENT:     Head: Normocephalic and atraumatic.  Eyes:     Conjunctiva/sclera: Conjunctivae normal.  Cardiovascular:     Rate and  Rhythm: Normal rate and regular rhythm.     Heart sounds: Normal heart sounds. No murmur.  Pulmonary:     Effort: No respiratory distress.     Breath sounds: Normal breath sounds.     Comments: hyperventilating Abdominal:     General: Bowel sounds are normal. There is no distension.     Palpations: Abdomen is soft.     Tenderness: There is abdominal tenderness (epigastric, lower abd). There is no guarding or rebound.  Musculoskeletal:     Cervical back: Neck supple.  Skin:    General: Skin is warm and dry.  Neurological:     Mental Status: She is alert.  Psychiatric:     Comments: Anxious, tearful, hyperventilating     ED Results / Procedures / Treatments   Labs (all labs ordered are listed, but only abnormal results are displayed) Labs Reviewed  CBC WITH DIFFERENTIAL/PLATELET - Abnormal; Notable for the following components:      Result Value   Hemoglobin 15.4 (*)    All other components within normal limits  COMPREHENSIVE METABOLIC PANEL - Abnormal; Notable for the following components:   Chloride 96 (*)    Glucose, Bld 127 (*)    Total Protein 8.8 (*)    Total Bilirubin 1.5 (*)    All other components within normal limits  LIPASE, BLOOD  HCG, SERUM, QUALITATIVE    EKG None  Radiology DG Chest Portable 1 View  Result Date: 04/20/2020 CLINICAL DATA:  29 year old female with shortness of breath EXAM: PORTABLE CHEST 1 VIEW COMPARISON:  None. FINDINGS: The lungs are clear and negative for focal airspace consolidation, pulmonary edema or suspicious pulmonary nodule. No pleural effusion or pneumothorax. Cardiac and mediastinal contours are within normal limits. No acute fracture or lytic or blastic osseous lesions. The visualized upper abdominal bowel gas pattern is unremarkable. IMPRESSION: Normal chest x-ray. Electronically Signed   By: Malachy Moan M.D.   On: 04/20/2020 12:57    Procedures Procedures (including critical care time)  Medications Ordered in  ED Medications  ondansetron (ZOFRAN) 8 mg in sodium chloride 0.9 % 50 mL IVPB (8 mg Intravenous Not Given 04/20/20 1244)  ketorolac (TORADOL) 30 MG/ML injection 30 mg (30 mg Intravenous Given 04/20/20 1248)  LORazepam (ATIVAN) injection 0.5 mg (0.5 mg Intravenous Given 04/20/20 1242)  sodium chloride 0.9 % bolus 1,000 mL (0 mLs Intravenous Stopped 04/20/20 1342)  ondansetron (ZOFRAN) 4 MG/2ML injection (8 mg  Given 04/20/20 1244)    ED Course  I have reviewed the triage vital  signs and the nursing notes.  Pertinent labs & imaging results that were available during my care of the patient were reviewed by me and considered in my medical decision making (see chart for details).    MDM Rules/Calculators/A&P                      29 y/o F presenting with NV and abd pain associated with her menstrual cycle. States this is typical of her menstrual cycle. Also c/o sob, she appears significantly anxious on exam and is hyperventilating. I suspect this is related to acute anxiety rxn related to her sxs rather than an emergent cardiopulmonary cause.   Will get labs, cxr, ekg, ua. Will give ivf, antiemetics, toradol, ativan, and gi cocktail.   Reviewed records, multiple similar presentations in the past. Has had two neg CT scans within the last year.   Reviewed/interpreted labs CBC CBC without leukocytosis.  Hemoglobin slightly elevated, suspect due to hemoconcentration from dehydration from vomiting CMP with no gross electrolyte derangement.  Normal kidney and liver function.  Total bilirubin slightly elevated however this appears consistent with her prior lab values. Lipase negative Beta hcg negative  EKG with NSR HR 65, no ischemic changes, no arrhythmia   CXR reviewed/interpreted -without evidence of pneumothorax, pneumonia, widened mediastinum.  On reassessment she feels much improved.  She is no longer vomiting and states that her pain is tolerable and is consistent with her regular menstrual  cramps now.  She has been able to tolerate p.o. without difficulty.  She is requesting an antiemetic for home.  Will give Rx for Zofran.  I have low suspicion for any emergent intra-abdominal pelvic pathology at this time.  Feel she is appropriate for discharge with symptomatic treatment.  Advised to follow-up in the ED for new or worsening symptoms in the meantime.  She was understanding of the plan and reasons to return.  All questions answered.  Patient stable for discharge.   Final Clinical Impression(s) / ED Diagnoses Final diagnoses:  Nausea and vomiting, intractability of vomiting not specified, unspecified vomiting type  Menstrual cramp    Rx / DC Orders ED Discharge Orders         Ordered    ondansetron (ZOFRAN) 4 MG tablet  Every 6 hours     04/20/20 1408    ibuprofen (ADVIL) 600 MG tablet  Every 6 hours PRN     04/20/20 1408           Nuriyah Hanline S, PA-C 04/20/20 1430    Alvira Monday, MD 04/20/20 2251

## 2020-04-21 ENCOUNTER — Other Ambulatory Visit: Payer: Self-pay

## 2020-04-21 ENCOUNTER — Encounter (HOSPITAL_BASED_OUTPATIENT_CLINIC_OR_DEPARTMENT_OTHER): Payer: Self-pay | Admitting: Emergency Medicine

## 2020-04-21 ENCOUNTER — Emergency Department (HOSPITAL_BASED_OUTPATIENT_CLINIC_OR_DEPARTMENT_OTHER)
Admission: EM | Admit: 2020-04-21 | Discharge: 2020-04-21 | Disposition: A | Discharge status: Home / Self Care | Payer: 59 | Attending: Emergency Medicine | Admitting: Emergency Medicine

## 2020-04-21 DIAGNOSIS — Z79899 Other long term (current) drug therapy: Secondary | ICD-10-CM | POA: Insufficient documentation

## 2020-04-21 DIAGNOSIS — Z888 Allergy status to other drugs, medicaments and biological substances status: Secondary | ICD-10-CM | POA: Insufficient documentation

## 2020-04-21 DIAGNOSIS — R112 Nausea with vomiting, unspecified: Secondary | ICD-10-CM | POA: Insufficient documentation

## 2020-04-21 DIAGNOSIS — F1210 Cannabis abuse, uncomplicated: Secondary | ICD-10-CM | POA: Insufficient documentation

## 2020-04-21 DIAGNOSIS — F1290 Cannabis use, unspecified, uncomplicated: Secondary | ICD-10-CM

## 2020-04-21 DIAGNOSIS — A599 Trichomoniasis, unspecified: Secondary | ICD-10-CM | POA: Insufficient documentation

## 2020-04-21 LAB — URINALYSIS, MICROSCOPIC (REFLEX)

## 2020-04-21 LAB — RAPID URINE DRUG SCREEN, HOSP PERFORMED
Amphetamines: NOT DETECTED
Barbiturates: NOT DETECTED
Benzodiazepines: NOT DETECTED
Cocaine: NOT DETECTED
Opiates: NOT DETECTED
Tetrahydrocannabinol: POSITIVE — AB

## 2020-04-21 LAB — WET PREP, GENITAL
Sperm: NONE SEEN
Yeast Wet Prep HPF POC: NONE SEEN

## 2020-04-21 LAB — URINALYSIS, ROUTINE W REFLEX MICROSCOPIC
Bilirubin Urine: NEGATIVE
Glucose, UA: NEGATIVE mg/dL
Ketones, ur: 15 mg/dL — AB
Leukocytes,Ua: NEGATIVE
Nitrite: NEGATIVE
Protein, ur: NEGATIVE mg/dL
Specific Gravity, Urine: 1.01 (ref 1.005–1.030)
pH: 8.5 — ABNORMAL HIGH (ref 5.0–8.0)

## 2020-04-21 MED ORDER — DROPERIDOL 2.5 MG/ML IJ SOLN
2.5000 mg | Freq: Once | INTRAMUSCULAR | Status: AC
  Administered 2020-04-21: 2.5 mg via INTRAVENOUS
  Filled 2020-04-21: qty 2

## 2020-04-21 MED ORDER — DOXYCYCLINE HYCLATE 100 MG PO CAPS
100.0000 mg | ORAL_CAPSULE | Freq: Two times a day (BID) | ORAL | 0 refills | Status: DC
Start: 2020-04-21 — End: 2020-06-14

## 2020-04-21 MED ORDER — PROMETHAZINE HCL 25 MG PO TABS: 25.0000 mg | ORAL_TABLET | Freq: Four times a day (QID) | ORAL | 0 refills | Status: AC | PRN

## 2020-04-21 MED ORDER — METRONIDAZOLE 500 MG PO TABS
2000.0000 mg | ORAL_TABLET | Freq: Once | ORAL | Status: AC
  Administered 2020-04-21: 2000 mg via ORAL
  Filled 2020-04-21: qty 4

## 2020-04-21 MED ORDER — KETOROLAC TROMETHAMINE 15 MG/ML IJ SOLN
15.0000 mg | Freq: Once | INTRAMUSCULAR | Status: AC
  Administered 2020-04-21: 15 mg via INTRAVENOUS
  Filled 2020-04-21: qty 1

## 2020-04-21 MED ORDER — CEFTRIAXONE SODIUM 500 MG IJ SOLR
500.0000 mg | Freq: Once | INTRAMUSCULAR | Status: AC
  Administered 2020-04-21: 500 mg via INTRAMUSCULAR
  Filled 2020-04-21: qty 500

## 2020-04-21 MED ORDER — LIDOCAINE HCL (PF) 1 % IJ SOLN
INTRAMUSCULAR | Status: AC
  Filled 2020-04-21: qty 5

## 2020-04-21 NOTE — ED Notes (Signed)
RN called to room, pt states " I feel like something isn't right". When asked to specify what doesn't feel right, pt states "it feels heavy to breathe". VSS (see flowsheet). Pt reassured that VSS, EDP notified of pt complaint and current VS. Pt requesting RN remove IV and BP cuff and states that she is anxious. Pt given ice chips and saltines per EDP instruction. Pt states her nausea is better at this time, but she is anxious.

## 2020-04-21 NOTE — ED Notes (Signed)
Pt up to bathroom following pelvic exam.

## 2020-04-21 NOTE — ED Triage Notes (Signed)
Brought in by [facility]. Pt seen yesterday in ED for vomiting x 3 days, dc with Zofran Rx. Pt states shortly after returning home and "medicine wore off" that she has not been able to keep fluids down. States she is on her menstrual cycle presently, and denies pain other than cramps. [facility] states pt was given Zofran 8mg  IV en route.

## 2020-04-21 NOTE — Discharge Instructions (Addendum)
You were seen today for ongoing nausea and vomiting.  You had a reassuring work-up yesterday.  Continue Zofran at home.  You will also be given a prescription for Phenergan.  You should stop smoking marijuana as this can contribute.  Additionally, you were found to have trichomonas in your urine.  You were tested and treated for STDs.  You need to inform partners.  Abstain from sexual activity for the next 10 days.

## 2020-04-21 NOTE — ED Provider Notes (Signed)
[facility] EMERGENCY DEPARTMENT Provider Note   CSN: [id number] Arrival date & time: 04/21/20  8101     History Chief Complaint  Patient presents with  . Emesis    Gloria Gonzales is a 29 y.o. female.  HPI     This is a 29 year old female who presents with nausea and vomiting.  Patient reports that has been persistent since being evaluated yesterday.  She states she has a history of dysmenorrhea and extensive nausea and vomiting with her periods.  She started her period on Wednesday.  She reports cramping that becomes intense and makes her vomit.  She is not noted any urinary symptoms but states that since she has been vomiting so much she has not urinated much.  She was given Zofran yesterday but has been unable to keep it down.  She currently is not having any cramps.  She was given a liter of fluids and 8 mg of Zofran by EMS.  She denies any chest pain, shortness of breath, fevers.  She denies any vaginal discharge or concerns for STDs.  She reports 1 sexual partner.  She does have history of marijuana abuse which was thought to be like to prior episodes of vomiting.  She reports to me that she has not used marijuana in over 1 month.  Past Medical History:  Diagnosis Date  . Endometriosis     There are no problems to display for this patient.   History reviewed. No pertinent surgical history.   OB History    Gravida  1   Para      Term      Preterm      AB      Living        SAB      TAB      Ectopic      Multiple      Live Births              No family history on file.  Social History   Tobacco Use  . Smoking status: Never Smoker  . Smokeless tobacco: Never Used  Substance Use Topics  . Alcohol use: Never  . Drug use: Not Currently    Types: Marijuana    Home Medications Prior to Admission medications   Medication Sig Start Date End Date Taking? Authorizing Provider  ibuprofen (ADVIL) 600 MG tablet Take 1 tablet (600 mg total)  by mouth every 6 (six) hours as needed. 04/20/20   Couture, Cortni S, PA-C  metoCLOPramide (REGLAN) 10 MG tablet Take 1 tablet (10 mg total) by mouth every 6 (six) hours. 05/14/19   Jeannie Fend, PA-C  metroNIDAZOLE (FLAGYL) 500 MG tablet Take 1 tablet (500 mg total) by mouth 2 (two) times daily. 05/11/19   Carlyle Basques P, PA-C  Multiple Vitamin (MULTIVITAMIN WITH MINERALS) TABS tablet Take 1 tablet by mouth daily.    [provider]  naproxen (NAPROSYN) 500 MG tablet Take 1 tablet (500 mg total) by mouth 2 (two) times daily. 05/05/19   Arlyn Dunning, PA-C  omeprazole (PRILOSEC) 20 MG capsule Take 1 capsule (20 mg total) by mouth daily. 11/16/19 12/16/19  Ronnie Doss A, PA-C  ondansetron (ZOFRAN) 4 MG tablet Take 1 tablet (4 mg total) by mouth every 6 (six) hours. 04/20/20   Couture, Cortni S, PA-C  promethazine (PHENERGAN) 25 MG tablet Take 1 tablet (25 mg total) by mouth every 6 (six) hours as needed for up to 3 days for nausea  or vomiting. 11/16/19 11/19/19  Arlyn Dunning, PA-C  traMADol (ULTRAM) 50 MG tablet Take 1 tablet (50 mg total) by mouth every 6 (six) hours as needed. 05/13/19   Geoffery Lyons, MD    Allergies    Clonazepam  Review of Systems   Review of Systems  Constitutional: Negative for fever.  Respiratory: Negative for shortness of breath.   Cardiovascular: Negative for chest pain.  Gastrointestinal: Positive for nausea and vomiting.  Genitourinary: Positive for menstrual problem and vaginal bleeding. Negative for dysuria and vaginal discharge.  Neurological: Negative for headaches.  All other systems reviewed and are negative.   Physical Exam Updated Vital Signs BP 129/81 (BP Location: Right Arm)   Pulse 67   Temp 99.2 F (37.3 C) (Oral)   Resp 20   Ht 1.676 m (5\' 6" )   Wt 75.1 kg   LMP 04/17/2020   SpO2 100%   BMI 26.71 kg/m   Physical Exam Vitals and nursing note reviewed.  Constitutional:      Appearance: She is well-developed. She is not  ill-appearing.  HENT:     Head: Normocephalic and atraumatic.     Mouth/Throat:     Mouth: Mucous membranes are moist.  Eyes:     Pupils: Pupils are equal, round, and reactive to light.  Cardiovascular:     Rate and Rhythm: Normal rate and regular rhythm.  Pulmonary:     Effort: Pulmonary effort is normal. No respiratory distress.  Abdominal:     General: Bowel sounds are normal.     Palpations: Abdomen is soft.     Tenderness: There is no abdominal tenderness. There is no guarding or rebound.  Genitourinary:    Comments: Moderate blood noted in the vaginal vault, no cervical friability or cervical motion tenderness, no adnexal tenderness Musculoskeletal:     Cervical back: Neck supple.  Skin:    General: Skin is warm and dry.  Neurological:     Mental Status: She is alert and oriented to person, place, and time.  Psychiatric:     Comments: Anxious appearing at times     ED Results / Procedures / Treatments   Labs (all labs ordered are listed, but only abnormal results are displayed) Labs Reviewed  RAPID URINE DRUG SCREEN, HOSP PERFORMED - Abnormal; Notable for the following components:      Result Value   Tetrahydrocannabinol POSITIVE (*)    All other components within normal limits  URINALYSIS, ROUTINE W REFLEX MICROSCOPIC - Abnormal; Notable for the following components:   pH 8.5 (*)    Hgb urine dipstick LARGE (*)    Ketones, ur 15 (*)    All other components within normal limits  URINALYSIS, MICROSCOPIC (REFLEX) - Abnormal; Notable for the following components:   Bacteria, UA FEW (*)    Trichomonas, UA PRESENT (*)    All other components within normal limits  WET PREP, GENITAL  GC/CHLAMYDIA PROBE AMP ([place]) NOT AT [facility]    EKG None  Radiology DG Chest Portable 1 View  Result Date: 04/20/2020 CLINICAL DATA:  29 year old female with shortness of breath EXAM: PORTABLE CHEST 1 VIEW COMPARISON:  None. FINDINGS: The lungs are clear and negative for focal  airspace consolidation, pulmonary edema or suspicious pulmonary nodule. No pleural effusion or pneumothorax. Cardiac and mediastinal contours are within normal limits. No acute fracture or lytic or blastic osseous lesions. The visualized upper abdominal bowel gas pattern is unremarkable. IMPRESSION: Normal chest x-ray. Electronically Signed   By: 34  Laurence Ferrari M.D.   On: 04/20/2020 12:57    Procedures Procedures (including critical care time)  Medications Ordered in ED Medications  metroNIDAZOLE (FLAGYL) tablet 2,000 mg (has no administration in time range)  cefTRIAXone (ROCEPHIN) injection 500 mg (has no administration in time range)  droperidol (INAPSINE) 2.5 MG/ML injection 2.5 mg (2.5 mg Intravenous Given 04/21/20 0544)  ketorolac (TORADOL) 15 MG/ML injection 15 mg (15 mg Intravenous Given 04/21/20 0544)    ED Course  I have reviewed the triage vital signs and the nursing notes.  Pertinent labs & imaging results that were available during my care of the patient were reviewed by me and considered in my medical decision making (see chart for details).    MDM Rules/Calculators/A&P                       Patient was seen and evaluated yesterday for nausea and vomiting.  She presents today with the same.  Vital signs are reassuring.  Initially noted to be tachycardic but heart rate settled into the 60s when she calmed down.  Lab work reviewed from yesterday.  She is not pregnant.  Basic lab was largely reassuring.  She did not have a urinalysis.  She is able to provide a urine sample.  Urinalysis and UDS was sent.  Patient was given droperidol and Toradol.  She is nontender on abdominal exam.  There is no localizing tenderness.  Doubt ovarian torsion or ovarian cyst.  Urinalysis shows 15 ketones in urine likely reflective of mild dehydration.  Positive for trichomoniasis.  Additionally she is positive for marijuana.  I discussed with her that I did not feel she was being forthcoming with her  marijuana use.  This can certainly be contributing to ongoing nausea and vomiting.  I encouraged her to discontinue use.  Additionally, pelvic exam was performed and additional STD testing was sent.  She was treated for gonorrhea and chlamydia as well as trichomoniasis.  She is counseled regarding safe sex and informing partners.  Patient is tolerating fluids without difficulty.  After history, exam, and medical workup I feel the patient has been appropriately medically screened and is safe for discharge home. Pertinent diagnoses were discussed with the patient. Patient was given return precautions.   Final Clinical Impression(s) / ED Diagnoses Final diagnoses:  Non-intractable vomiting with nausea, unspecified vomiting type  Trichomoniasis  Marijuana use    Rx / DC Orders ED Discharge Orders    None       Navjot Pilgrim, Barbette Hair, MD 04/21/20 [phone]

## 2020-04-22 LAB — GC/CHLAMYDIA PROBE AMP (~~LOC~~) NOT AT ARMC
Chlamydia: NEGATIVE
Comment: NEGATIVE
Comment: NORMAL
Neisseria Gonorrhea: NEGATIVE

## 2020-06-14 ENCOUNTER — Emergency Department (HOSPITAL_BASED_OUTPATIENT_CLINIC_OR_DEPARTMENT_OTHER)
Admission: EM | Admit: 2020-06-14 | Discharge: 2020-06-14 | Disposition: A | Discharge status: Home / Self Care | Payer: Self-pay | Attending: Emergency Medicine | Admitting: Emergency Medicine

## 2020-06-14 ENCOUNTER — Other Ambulatory Visit: Payer: Self-pay

## 2020-06-14 ENCOUNTER — Encounter (HOSPITAL_BASED_OUTPATIENT_CLINIC_OR_DEPARTMENT_OTHER): Payer: Self-pay | Admitting: Emergency Medicine

## 2020-06-14 ENCOUNTER — Emergency Department (HOSPITAL_BASED_OUTPATIENT_CLINIC_OR_DEPARTMENT_OTHER): Payer: Self-pay

## 2020-06-14 DIAGNOSIS — M25511 Pain in right shoulder: Secondary | ICD-10-CM | POA: Insufficient documentation

## 2020-06-14 DIAGNOSIS — Y998 Other external cause status: Secondary | ICD-10-CM | POA: Insufficient documentation

## 2020-06-14 DIAGNOSIS — Y93B2 Activity, push-ups, pull-ups, sit-ups: Secondary | ICD-10-CM | POA: Insufficient documentation

## 2020-06-14 DIAGNOSIS — Y9289 Other specified places as the place of occurrence of the external cause: Secondary | ICD-10-CM | POA: Insufficient documentation

## 2020-06-14 DIAGNOSIS — X500XXA Overexertion from strenuous movement or load, initial encounter: Secondary | ICD-10-CM | POA: Insufficient documentation

## 2020-06-14 NOTE — Discharge Instructions (Signed)
You have been treated for a shoulder injury.  I want you to apply ice to the area for the first 24 hours.  Please do not place ice on bare skin as this can create a burn.  You may alternate between taking ibuprofen and Tylenol every 6 hours for pain.  For example you can take Tylenol wait 6 hours then take ibuprofen then wait 6 hours and repeat.  After 24 hours I want you to apply heat to the area and stretch it, please refrain from lifting heavy weights may take up to 3 weeks to fully heal.  I want you to follow-up with your primary care doctor in 3 weeks if pain persists.  If you develop severe shoulder pain, your arm becomes numb or tingling, or arm changes color, you develop shortness of breath or chest pain you need to come back to the emergency department as the symptoms need to be reevaluated.

## 2020-06-14 NOTE — ED Provider Notes (Signed)
[facility] EMERGENCY DEPARTMENT Provider Note   CSN: [id number] Arrival date & time: 06/14/20  2112     History Chief Complaint  Patient presents with   Shoulder Pain    Gloria Gonzales is a 29 y.o. female.  HPI   Patient presents to the emergency department with chief complaint of right shoulder pain that started today.  Patient states while she was working out and doing single arm pull down  she felt a severe pain in her right lateral dorsi muscle.  She denies any numbness or tingling in her arm, she is able to raise it without difficulty, denies any weakness in her arm.  She denies hitting her head, losing consciousness.  She is not taking any medication for pain, she has no significant medical history and does not take any medication on a daily basis.  Past Medical History:  Diagnosis Date   Endometriosis     There are no problems to display for this patient.   History reviewed. No pertinent surgical history.   OB History    Gravida  1   Para      Term      Preterm      AB      Living        SAB      TAB      Ectopic      Multiple      Live Births              Family History  Problem Relation Age of Onset   Hypertension Mother    Hypertension Father    Diabetes Father    Stroke Father     Social History   Tobacco Use   Smoking status: Never Smoker   Smokeless tobacco: Never Used  Vaping Use   Vaping Use: Never used  Substance Use Topics   Alcohol use: Never   Drug use: Not Currently    Types: Marijuana    Home Medications Prior to Admission medications   Medication Sig Start Date End Date Taking? Authorizing Provider  ibuprofen (ADVIL) 600 MG tablet Take 1 tablet (600 mg total) by mouth every 6 (six) hours as needed. 04/20/20   Couture, Cortni S, PA-C  Multiple Vitamin (MULTIVITAMIN WITH MINERALS) TABS tablet Take 1 tablet by mouth daily.    [provider]  naproxen (NAPROSYN) 500 MG tablet Take 1  tablet (500 mg total) by mouth 2 (two) times daily. 05/05/19   Ronnie Doss A, PA-C  ondansetron (ZOFRAN) 4 MG tablet Take 1 tablet (4 mg total) by mouth every 6 (six) hours. 04/20/20   Couture, Cortni S, PA-C  promethazine (PHENERGAN) 25 MG tablet Take 1 tablet (25 mg total) by mouth every 6 (six) hours as needed for nausea or vomiting. 04/21/20   Horton, Mayer Masker, MD  traMADol (ULTRAM) 50 MG tablet Take 1 tablet (50 mg total) by mouth every 6 (six) hours as needed. 05/13/19   Geoffery Lyons, MD    Allergies    Shrimp [shellfish allergy] and Clonazepam  Review of Systems   Review of Systems  Constitutional: Negative for chills and fever.  HENT: Negative for congestion.   Respiratory: Negative for shortness of breath.   Cardiovascular: Negative for chest pain.  Gastrointestinal: Negative for abdominal pain.  Genitourinary: Negative for enuresis.  Musculoskeletal: Negative for back pain.       Patient admits to right shoulder pain.  Skin: Negative for rash.  Neurological: Negative for dizziness  and headaches.  Hematological: Does not bruise/bleed easily.    Physical Exam Updated Vital Signs BP 114/70 (BP Location: Left Arm)    Pulse 80    Temp 98.5 F (36.9 C) (Oral)    Resp 16    Ht 5\' 6"  (1.676 m)    Wt 74.8 kg    LMP 06/06/2020 (Exact Date)    SpO2 98%    BMI 26.63 kg/m   Physical Exam Vitals and nursing note reviewed.  Constitutional:      General: She is not in acute distress.    Appearance: Normal appearance. She is not ill-appearing or diaphoretic.  HENT:     Head: Normocephalic and atraumatic.     Nose: No congestion or rhinorrhea.  Eyes:     General: No scleral icterus.       Right eye: No discharge.        Left eye: No discharge.     Conjunctiva/sclera: Conjunctivae normal.  Pulmonary:     Effort: Pulmonary effort is normal. No respiratory distress.     Breath sounds: Normal breath sounds. No wheezing.  Musculoskeletal:        General: Tenderness and signs of  injury present.     Cervical back: Neck supple.     Right lower leg: No edema.     Left lower leg: No edema.     Comments: Patient has right shoulder pain that is below her scapula.  There was no rashes, ecchymosis, lacerations, or other gross abnormalities noted.  Patient had tenderness to palpation along her dorsi lateralis. patient had full range of motion, 5 out of 5 strength, good sensation, good radial pulse, good capillary refill.  Skin:    General: Skin is warm and dry.     Coloration: Skin is not jaundiced or pale.  Neurological:     Mental Status: She is alert and oriented to person, place, and time.  Psychiatric:        Mood and Affect: Mood normal.     ED Results / Procedures / Treatments   Labs (all labs ordered are listed, but only abnormal results are displayed) Labs Reviewed - No data to display  EKG None  Radiology DG Shoulder Right  Result Date: 06/14/2020 CLINICAL DATA:  Shoulder injury EXAM: RIGHT SHOULDER - 2+ VIEW COMPARISON:  None. FINDINGS: There is no evidence of fracture or dislocation. There is no evidence of arthropathy or other focal bone abnormality. Soft tissues are unremarkable. IMPRESSION: Negative. Electronically Signed   By: Donavan Foil M.D.   On: 06/14/2020 21:48    Procedures Procedures (including critical care time)  Medications Ordered in ED Medications - No data to display  ED Course  I have reviewed the triage vital signs and the nursing notes.  Pertinent labs & imaging results that were available during my care of the patient were reviewed by me and considered in my medical decision making (see chart for details).    MDM Rules/Calculators/A&P                          I have personally reviewed all imaging, labs and have interpreted them.  Due to patient's presentation most concerned for fracture versus dislocation versus ligament/tendon damage.  Patient had right shoulder x-ray which did not show any acute abnormalities, no  fracture, no dislocations noted, making fracture or dislocation unlikely.  I have low suspicion for tendon or ligament damage as the pain was located more  along her lateralmost dorsi muscle and she had full range of motion, 5 out of 5 strength..  Patient appears to be resting comfortably in bed, no obvious distress.  Patient's vitals have remained stable patient does not meet emergent criteria to be admitted to the hospital.  Likely the patient's pain is a result of muscular strain or tear.  Patient was given at home instruction as well as strict return precautions.  Patient discussed with attending who agrees with assessment and plan.  Patient was explained the results and plan, patient verbalized that she understood and agrees with said plan. Final Clinical Impression(s) / ED Diagnoses Final diagnoses:  Acute pain of right shoulder    Rx / DC Orders ED Discharge Orders    None       Barnie Del 06/14/20 2300    Tilden Fossa, MD 06/15/20 1050

## 2020-06-14 NOTE — ED Triage Notes (Signed)
Pt states she injured her right shoulder at the gym yesterday  Pt states she worked out yesterday and today she is having a burning pain in her shoulder and states she thinks it is swollen   Pt has full ROM to right arm

## 2020-10-14 ENCOUNTER — Other Ambulatory Visit: Payer: Self-pay

## 2020-10-14 ENCOUNTER — Encounter (HOSPITAL_BASED_OUTPATIENT_CLINIC_OR_DEPARTMENT_OTHER): Payer: Self-pay | Admitting: Emergency Medicine

## 2020-10-14 ENCOUNTER — Emergency Department (HOSPITAL_BASED_OUTPATIENT_CLINIC_OR_DEPARTMENT_OTHER)
Admission: EM | Admit: 2020-10-14 | Discharge: 2020-10-14 | Disposition: A | Discharge status: Home / Self Care | Payer: 59 | Attending: Emergency Medicine | Admitting: Emergency Medicine

## 2020-10-14 DIAGNOSIS — R109 Unspecified abdominal pain: Secondary | ICD-10-CM | POA: Insufficient documentation

## 2020-10-14 DIAGNOSIS — N809 Endometriosis, unspecified: Secondary | ICD-10-CM | POA: Diagnosis not present

## 2020-10-14 DIAGNOSIS — R112 Nausea with vomiting, unspecified: Secondary | ICD-10-CM | POA: Insufficient documentation

## 2020-10-14 LAB — CBC WITH DIFFERENTIAL/PLATELET
Abs Immature Granulocytes: 0.01 10*3/uL (ref 0.00–0.07)
Basophils Absolute: 0 10*3/uL (ref 0.0–0.1)
Basophils Relative: 0 %
Eosinophils Absolute: 0 10*3/uL (ref 0.0–0.5)
Eosinophils Relative: 0 %
HCT: 40.6 % (ref 36.0–46.0)
Hemoglobin: 13.9 g/dL (ref 12.0–15.0)
Immature Granulocytes: 0 %
Lymphocytes Relative: 23 %
Lymphs Abs: 1.3 10*3/uL (ref 0.7–4.0)
MCH: 32.9 pg (ref 26.0–34.0)
MCHC: 34.2 g/dL (ref 30.0–36.0)
MCV: 96 fL (ref 80.0–100.0)
Monocytes Absolute: 0.2 10*3/uL (ref 0.1–1.0)
Monocytes Relative: 3 %
Neutro Abs: 4.2 10*3/uL (ref 1.7–7.7)
Neutrophils Relative %: 74 %
Platelets: 211 10*3/uL (ref 150–400)
RBC: 4.23 MIL/uL (ref 3.87–5.11)
RDW: 12 % (ref 11.5–15.5)
WBC: 5.8 10*3/uL (ref 4.0–10.5)
nRBC: 0 % (ref 0.0–0.2)

## 2020-10-14 LAB — LIPASE, BLOOD: Lipase: 28 U/L (ref 11–51)

## 2020-10-14 LAB — COMPREHENSIVE METABOLIC PANEL
ALT: 22 U/L (ref 0–44)
AST: 24 U/L (ref 15–41)
Albumin: 4.5 g/dL (ref 3.5–5.0)
Alkaline Phosphatase: 43 U/L (ref 38–126)
Anion gap: 12 (ref 5–15)
BUN: 12 mg/dL (ref 6–20)
CO2: 23 mmol/L (ref 22–32)
Calcium: 9.2 mg/dL (ref 8.9–10.3)
Chloride: 101 mmol/L (ref 98–111)
Creatinine, Ser: 0.94 mg/dL (ref 0.44–1.00)
GFR, Estimated: >60 mL/min (ref >60)
Glucose, Bld: 116 mg/dL — ABNORMAL HIGH (ref 70–99)
Potassium: 3.6 mmol/L (ref 3.5–5.1)
Sodium: 136 mmol/L (ref 135–145)
Total Bilirubin: 1.2 mg/dL (ref 0.3–1.2)
Total Protein: 8 g/dL (ref 6.5–8.1)

## 2020-10-14 MED ORDER — DIPHENHYDRAMINE HCL 50 MG/ML IJ SOLN
25.0000 mg | Freq: Once | INTRAMUSCULAR | Status: AC
  Administered 2020-10-14: 25 mg via INTRAVENOUS
  Filled 2020-10-14: qty 1

## 2020-10-14 MED ORDER — SODIUM CHLORIDE 0.9 % IV BOLUS: 1000.0000 mL | Freq: Once | INTRAVENOUS | Status: DC

## 2020-10-14 MED ORDER — SODIUM CHLORIDE 0.9 % IV BOLUS
1000.0000 mL | Freq: Once | INTRAVENOUS | Status: AC
  Administered 2020-10-14: 1000 mL via INTRAVENOUS

## 2020-10-14 MED ORDER — ONDANSETRON HCL 4 MG/2ML IJ SOLN
4.0000 mg | Freq: Once | INTRAMUSCULAR | Status: AC
  Administered 2020-10-14: 4 mg via INTRAVENOUS
  Filled 2020-10-14: qty 2

## 2020-10-14 MED ORDER — HALOPERIDOL LACTATE 5 MG/ML IJ SOLN
2.0000 mg | Freq: Once | INTRAMUSCULAR | Status: AC
  Administered 2020-10-14: 2 mg via INTRAVENOUS
  Filled 2020-10-14: qty 1

## 2020-10-14 NOTE — ED Notes (Signed)
This RN went to pt room because of IV pump beeping; pt was standing at door and asked when she could go home; this RN told pt that the EDP had ordered another bag of fluid and urine sample; pt asked how long that would take, and she was informed the fluid would take an hour; pt requested the EDP be asked if it was necessary; this RN relayed to the EDP, who discontinued urine sample and second bag of fluids

## 2020-10-14 NOTE — ED Provider Notes (Signed)
[facility] EMERGENCY DEPARTMENT Provider Note   CSN: [id number] Arrival date & time: 10/14/20  2094     History Chief Complaint  Patient presents with  . Emesis    Gloria Gonzales is a 29 y.o. female.  Patient presents to the emergency department for evaluation of nausea and vomiting.  Patient has a history of frequent recurrent episodes of nausea and vomiting requiring ER visits.  Patient reports that she has been vomiting all day.  She reports diffuse abdominal pain.        Past Medical History:  Diagnosis Date  . Endometriosis     There are no problems to display for this patient.   History reviewed. No pertinent surgical history.   OB History    Gravida  1   Para      Term      Preterm      AB      Living        SAB      TAB      Ectopic      Multiple      Live Births              Family History  Problem Relation Age of Onset  . Hypertension Mother   . Hypertension Father   . Diabetes Father   . Stroke Father     Social History   Tobacco Use  . Smoking status: Never Smoker  . Smokeless tobacco: Never Used  Vaping Use  . Vaping Use: Never used  Substance Use Topics  . Alcohol use: Never  . Drug use: Not Currently    Types: Marijuana    Home Medications Prior to Admission medications   Medication Sig Start Date End Date Taking? Authorizing Provider  ibuprofen (ADVIL) 600 MG tablet Take 1 tablet (600 mg total) by mouth every 6 (six) hours as needed. 04/20/20   Couture, Cortni S, PA-C  Multiple Vitamin (MULTIVITAMIN WITH MINERALS) TABS tablet Take 1 tablet by mouth daily.    [provider]  naproxen (NAPROSYN) 500 MG tablet Take 1 tablet (500 mg total) by mouth 2 (two) times daily. 05/05/19   Ronnie Doss A, PA-C  ondansetron (ZOFRAN) 4 MG tablet Take 1 tablet (4 mg total) by mouth every 6 (six) hours. 04/20/20   Couture, Cortni S, PA-C  promethazine (PHENERGAN) 25 MG tablet Take 1 tablet (25 mg total) by mouth  every 6 (six) hours as needed for nausea or vomiting. 04/21/20   Horton, Mayer Masker, MD  traMADol (ULTRAM) 50 MG tablet Take 1 tablet (50 mg total) by mouth every 6 (six) hours as needed. 05/13/19   Geoffery Lyons, MD    Allergies    Shrimp [shellfish allergy] and Clonazepam  Review of Systems   Review of Systems  Gastrointestinal: Positive for abdominal pain, nausea and vomiting.  All other systems reviewed and are negative.   Physical Exam Updated Vital Signs BP 121/85 (BP Location: Right Arm)   Pulse 60   Temp 97.8 F (36.6 C) (Oral)   Resp 20   Ht 5\' 6"  (1.676 m)   Wt 72.6 kg   SpO2 100%   BMI 25.82 kg/m   Physical Exam Constitutional:      General: She is in acute distress (actively vomiting).  Abdominal:     General: Bowel sounds are normal. There is no distension.     ED Results / Procedures / Treatments   Labs (all labs ordered are listed, but  only abnormal results are displayed) Labs Reviewed  COMPREHENSIVE METABOLIC PANEL - Abnormal; Notable for the following components:      Result Value   Glucose, Bld 116 (*)    All other components within normal limits  CBC WITH DIFFERENTIAL/PLATELET  LIPASE, BLOOD  URINALYSIS, ROUTINE W REFLEX MICROSCOPIC  RAPID URINE DRUG SCREEN, HOSP PERFORMED  PREGNANCY, URINE    EKG None  Radiology No results found.  Procedures Procedures (including critical care time)  Medications Ordered in ED Medications  sodium chloride 0.9 % bolus 1,000 mL (1,000 mLs Intravenous New Bag/Given 10/14/20 0321)    Followed by  sodium chloride 0.9 % bolus 1,000 mL (has no administration in time range)  ondansetron (ZOFRAN) injection 4 mg (4 mg Intravenous Given 10/14/20 0322)  haloperidol lactate (HALDOL) injection 2 mg (2 mg Intravenous Given 10/14/20 0322)  diphenhydrAMINE (BENADRYL) injection 25 mg (25 mg Intravenous Given 10/14/20 0322)    ED Course  I have reviewed the triage vital signs and the nursing notes.  Pertinent labs  & imaging results that were available during my care of the patient were reviewed by me and considered in my medical decision making (see chart for details).    MDM Rules/Calculators/A&P                          Patient presents to the emergency department for evaluation of nausea and vomiting.  Patient has a history of recurrent vomiting with multiple ER visits.  Record indicates a history of cannabinoid hyperemesis syndrome.  Patient with diffuse abdominal pain but a nonfocal, benign abdominal exam on arrival.  Lab work reassuring.  Patient has had significant improvement with treatment.  No further work-up necessary.  Final Clinical Impression(s) / ED Diagnoses Final diagnoses:  Non-intractable vomiting with nausea, unspecified vomiting type    Rx / DC Orders ED Discharge Orders    None       Dequann Vandervelden, Canary Brim, MD 10/14/20 [phone]

## 2020-11-30 ENCOUNTER — Encounter (HOSPITAL_BASED_OUTPATIENT_CLINIC_OR_DEPARTMENT_OTHER): Payer: Self-pay | Admitting: *Deleted

## 2020-11-30 ENCOUNTER — Other Ambulatory Visit: Payer: Self-pay

## 2020-11-30 DIAGNOSIS — R109 Unspecified abdominal pain: Secondary | ICD-10-CM | POA: Insufficient documentation

## 2020-11-30 DIAGNOSIS — Z3A01 Less than 8 weeks gestation of pregnancy: Secondary | ICD-10-CM | POA: Diagnosis not present

## 2020-11-30 DIAGNOSIS — O26891 Other specified pregnancy related conditions, first trimester: Secondary | ICD-10-CM | POA: Insufficient documentation

## 2020-11-30 NOTE — ED Triage Notes (Signed)
Pt reports she is [redacted] weeks pregnant and has cramping "since I thought my period was coming on". States cramping is worse today. Denies vaginal bleeding, denies vaginal discharge

## 2020-12-01 ENCOUNTER — Emergency Department (HOSPITAL_BASED_OUTPATIENT_CLINIC_OR_DEPARTMENT_OTHER)
Admission: EM | Admit: 2020-12-01 | Discharge: 2020-12-01 | Disposition: A | Discharge status: Home / Self Care | Payer: 59 | Attending: Emergency Medicine | Admitting: Emergency Medicine

## 2020-12-01 DIAGNOSIS — O26891 Other specified pregnancy related conditions, first trimester: Secondary | ICD-10-CM

## 2020-12-01 DIAGNOSIS — R109 Unspecified abdominal pain: Secondary | ICD-10-CM

## 2020-12-01 LAB — CBC WITH DIFFERENTIAL/PLATELET
Abs Immature Granulocytes: 0.02 10*3/uL (ref 0.00–0.07)
Basophils Absolute: 0 10*3/uL (ref 0.0–0.1)
Basophils Relative: 1 %
Eosinophils Absolute: 0.1 10*3/uL (ref 0.0–0.5)
Eosinophils Relative: 1 %
HCT: 36.1 % (ref 36.0–46.0)
Hemoglobin: 12.7 g/dL (ref 12.0–15.0)
Immature Granulocytes: 0 %
Lymphocytes Relative: 37 %
Lymphs Abs: 2.9 10*3/uL (ref 0.7–4.0)
MCH: 33.1 pg (ref 26.0–34.0)
MCHC: 35.2 g/dL (ref 30.0–36.0)
MCV: 94 fL (ref 80.0–100.0)
Monocytes Absolute: 0.5 10*3/uL (ref 0.1–1.0)
Monocytes Relative: 6 %
Neutro Abs: 4.3 10*3/uL (ref 1.7–7.7)
Neutrophils Relative %: 55 %
Platelets: 206 10*3/uL (ref 150–400)
RBC: 3.84 MIL/uL — ABNORMAL LOW (ref 3.87–5.11)
RDW: 11.4 % — ABNORMAL LOW (ref 11.5–15.5)
WBC: 7.8 10*3/uL (ref 4.0–10.5)
nRBC: 0 % (ref 0.0–0.2)

## 2020-12-01 LAB — URINALYSIS, ROUTINE W REFLEX MICROSCOPIC
Bilirubin Urine: NEGATIVE
Glucose, UA: NEGATIVE mg/dL
Hgb urine dipstick: NEGATIVE
Ketones, ur: NEGATIVE mg/dL
Leukocytes,Ua: NEGATIVE
Nitrite: NEGATIVE
Protein, ur: NEGATIVE mg/dL
Specific Gravity, Urine: 1.02 (ref 1.005–1.030)
pH: 7 (ref 5.0–8.0)

## 2020-12-01 LAB — HCG, QUANTITATIVE, PREGNANCY: hCG, Beta Chain, Quant, S: 136755 m[IU]/mL — ABNORMAL HIGH (ref <5)

## 2020-12-01 NOTE — ED Provider Notes (Signed)
[facility] EMERGENCY DEPARTMENT Provider Note   CSN: [id number] Arrival date & time: 11/30/20  1929     History Chief Complaint  Patient presents with  . Abdominal Cramping    [redacted] weeks pregnant    Gloria Gonzales is a 29 y.o. female.  The history is provided by the patient.  Abdominal Cramping This is a new problem. The current episode started more than 2 days ago. The problem occurs daily. The problem has been gradually worsening. Associated symptoms include abdominal pain. Nothing aggravates the symptoms. Nothing relieves the symptoms.  Patient reports this is her first pregnancy at approximately 7 weeks.  She reports she has had daily abdominal cramping since being found pregnant.  She reports it is worsened over the past day.  She is scheduled for a first trimester ultrasound next week.  No fevers, no vaginal bleeding or discharge.  She has had daily morning sickness and her OB/GYN is treating her for this     Past Medical History:  Diagnosis Date  . Endometriosis     There are no problems to display for this patient.   History reviewed. No pertinent surgical history.   OB History    Gravida  2   Para      Term      Preterm      AB      Living        SAB      TAB      Ectopic      Multiple      Live Births              Family History  Problem Relation Age of Onset  . Hypertension Mother   . Hypertension Father   . Diabetes Father   . Stroke Father     Social History   Tobacco Use  . Smoking status: Never Smoker  . Smokeless tobacco: Never Used  Vaping Use  . Vaping Use: Never used  Substance Use Topics  . Alcohol use: Never  . Drug use: Not Currently    Types: Marijuana    Home Medications Prior to Admission medications   Medication Sig Start Date End Date Taking? Authorizing Provider  ibuprofen (ADVIL) 600 MG tablet Take 1 tablet (600 mg total) by mouth every 6 (six) hours as needed. 04/20/20   Couture, Cortni S, PA-C   Multiple Vitamin (MULTIVITAMIN WITH MINERALS) TABS tablet Take 1 tablet by mouth daily.    [provider]  naproxen (NAPROSYN) 500 MG tablet Take 1 tablet (500 mg total) by mouth 2 (two) times daily. 05/05/19   Ronnie Doss A, PA-C  ondansetron (ZOFRAN) 4 MG tablet Take 1 tablet (4 mg total) by mouth every 6 (six) hours. 04/20/20   Couture, Cortni S, PA-C  promethazine (PHENERGAN) 25 MG tablet Take 1 tablet (25 mg total) by mouth every 6 (six) hours as needed for nausea or vomiting. 04/21/20   Horton, Mayer Masker, MD  traMADol (ULTRAM) 50 MG tablet Take 1 tablet (50 mg total) by mouth every 6 (six) hours as needed. 05/13/19   Geoffery Lyons, MD    Allergies    Shrimp [shellfish allergy] and Clonazepam  Review of Systems   Review of Systems  Constitutional: Negative for fever.  Gastrointestinal: Positive for abdominal pain, nausea and vomiting.       "morning sickness"  Genitourinary: Negative for dysuria, vaginal bleeding and vaginal discharge.  All other systems reviewed and are negative.   Physical  Exam Updated Vital Signs BP 112/65   Pulse 65   Temp 98.1 F (36.7 C) (Oral)   Resp 14   Ht 1.676 m (5\' 6" )   Wt 69.4 kg   LMP 10/12/2020   SpO2 100%   Breastfeeding No   BMI 24.69 kg/m   Physical Exam CONSTITUTIONAL: Well developed/well nourished HEAD: Normocephalic/atraumatic EYES: EOMI/PERRL ENMT: Mucous membranes moist NECK: supple no meningeal signs SPINE/BACK:entire spine nontender CV: S1/S2 noted, no murmurs/rubs/gallops noted LUNGS: Lungs are clear to auscultation bilaterally, no apparent distress ABDOMEN: soft, nontender, no rebound or guarding, bowel sounds noted throughout abdomen GU:no cva tenderness NEURO: Pt is awake/alert/appropriate, moves all extremitiesx4.  No facial droop.   EXTREMITIES: pulses normal/equal, full ROM SKIN: warm, color normal PSYCH: no abnormalities of mood noted, alert and oriented to situation  ED Results / Procedures /  Treatments   Labs (all labs ordered are listed, but only abnormal results are displayed) Labs Reviewed  CBC WITH DIFFERENTIAL/PLATELET - Abnormal; Notable for the following components:      Result Value   RBC 3.84 (*)    RDW 11.4 (*)    All other components within normal limits  HCG, QUANTITATIVE, PREGNANCY - Abnormal; Notable for the following components:   hCG, Beta Chain, Quant, S 10/14/2020 (*)    All other components within normal limits  URINALYSIS, ROUTINE W REFLEX MICROSCOPIC    EKG None  Radiology No results found.  Procedures Ultrasound ED OB Pelvic  Date/Time: 12/01/2020 2:45 AM Performed by: 14/04/2020, MD Authorized by: Zadie Rhine, MD   Procedure details:    Indications: pregnant with abdominal pain     Assess:  Intrauterine pregnancy   Technique:  Transabdominal obstetric (HCG+) exam   Images: archived   Study Limitations: patient compliance Uterine findings:    Intrauterine pregnancy: identified     Single gestation: identified     Gestational sac: identified     Yolk sac: identified     Fetal heart rate: identified          Medications Ordered in ED Medications - No data to display  ED Course  I have reviewed the triage vital signs and the nursing notes.  Pertinent labs   results that were available during my care of the patient were reviewed by me and considered in my medical decision making (see chart for details).    MDM Rules/Calculators/A&P                          Patient reports frequent abdominal cramping since being found out she was pregnant.  She felt that it was worsening and wanted to be evaluated.  Denies any bleeding or discharge.  Limited bedside ultrasound reveals IUP with fetal heart rate detected.  Patient will follow-up next week for her formal ultrasound.  Patient declines any further medications & will be discharged home Final Clinical Impression(s) / ED Diagnoses Final diagnoses:  Abdominal pain during pregnancy  in first trimester    Rx / DC Orders ED Discharge Orders    None       Zadie Rhine, MD 12/01/20 0320

## 2020-12-07 ENCOUNTER — Other Ambulatory Visit: Payer: Self-pay

## 2020-12-07 ENCOUNTER — Inpatient Hospital Stay (HOSPITAL_COMMUNITY): Payer: 59

## 2020-12-07 ENCOUNTER — Inpatient Hospital Stay (HOSPITAL_COMMUNITY)
Admission: AD | Admit: 2020-12-07 | Discharge: 2020-12-07 | Disposition: A | Discharge status: Home / Self Care | Payer: 59 | Attending: Obstetrics & Gynecology | Admitting: Obstetrics & Gynecology

## 2020-12-07 ENCOUNTER — Encounter (HOSPITAL_COMMUNITY): Payer: Self-pay | Admitting: Obstetrics & Gynecology

## 2020-12-07 DIAGNOSIS — Z3A08 8 weeks gestation of pregnancy: Secondary | ICD-10-CM | POA: Diagnosis not present

## 2020-12-07 DIAGNOSIS — Z6741 Type O blood, Rh negative: Secondary | ICD-10-CM | POA: Insufficient documentation

## 2020-12-07 DIAGNOSIS — B9689 Other specified bacterial agents as the cause of diseases classified elsewhere: Secondary | ICD-10-CM

## 2020-12-07 DIAGNOSIS — O4690 Antepartum hemorrhage, unspecified, unspecified trimester: Secondary | ICD-10-CM

## 2020-12-07 DIAGNOSIS — O209 Hemorrhage in early pregnancy, unspecified: Secondary | ICD-10-CM | POA: Diagnosis not present

## 2020-12-07 DIAGNOSIS — Z6740 Type O blood, Rh positive: Secondary | ICD-10-CM

## 2020-12-07 DIAGNOSIS — O23591 Infection of other part of genital tract in pregnancy, first trimester: Secondary | ICD-10-CM | POA: Insufficient documentation

## 2020-12-07 DIAGNOSIS — O468X1 Other antepartum hemorrhage, first trimester: Secondary | ICD-10-CM

## 2020-12-07 HISTORY — DX: Anxiety disorder, unspecified: F41.9

## 2020-12-07 LAB — WET PREP, GENITAL
Sperm: NONE SEEN
Trich, Wet Prep: NONE SEEN
Yeast Wet Prep HPF POC: NONE SEEN

## 2020-12-07 LAB — URINALYSIS, ROUTINE W REFLEX MICROSCOPIC
Bilirubin Urine: NEGATIVE
Glucose, UA: NEGATIVE mg/dL
Hgb urine dipstick: NEGATIVE
Ketones, ur: NEGATIVE mg/dL
Leukocytes,Ua: NEGATIVE
Nitrite: NEGATIVE
Protein, ur: NEGATIVE mg/dL
Specific Gravity, Urine: 1.015 (ref 1.005–1.030)
pH: 5 (ref 5.0–8.0)

## 2020-12-07 LAB — HIV ANTIBODY (ROUTINE TESTING W REFLEX): HIV Screen 4th Generation wRfx: NONREACTIVE

## 2020-12-07 MED ORDER — METRONIDAZOLE 500 MG PO TABS: 500.0000 mg | ORAL_TABLET | Freq: Two times a day (BID) | ORAL | 0 refills | Status: AC

## 2020-12-07 NOTE — MAU Provider Note (Signed)
History     CSN: [id number]  Arrival date and time: 12/07/20 6195   Event Date/Time   First Provider Initiated Contact with Patient 12/07/20 2158      Chief Complaint  Patient presents with  . 2 months preg,  spotting  . Vaginal Bleeding   HPI   Ms.Gloria Gonzales is a 29 y.o. female G1P0 @ [redacted]w[redacted]d here in MAU with vaginal bleeding and cramping. The cramping has been present for 1 week; she was seen in the ED last week and US showed an IUP.   The bleeding worsened today- no recent intercourse. Every time she wipes she notices bright red blood. The bleeding is light, enough to wear a panty liner only.   OB History    Gravida  1   Para      Term      Preterm      AB      Living        SAB      IAB      Ectopic      Multiple      Live Births              Past Medical History:  Diagnosis Date  . Anxiety     History reviewed. No pertinent surgical history.  Family History  Problem Relation Age of Onset  . Hypertension Mother   . Hypertension Father   . Diabetes Father   . Stroke Father     Social History   Tobacco Use  . Smoking status: Never Smoker  . Smokeless tobacco: Never Used  Vaping Use  . Vaping Use: Never used  Substance Use Topics  . Alcohol use: Never  . Drug use: Not Currently    Types: Marijuana    Comment: last used 10/21    Allergies:  Allergies  Allergen Reactions  . Shrimp [Shellfish Allergy]   . Clonazepam Itching and Rash    Medications Prior to Admission  Medication Sig Dispense Refill Last Dose  . Prenatal Vit-Fe Fumarate-FA (PRENATAL VITAMIN PO) Take 1 tablet by mouth daily.   12/07/2020 at Unknown time  . pyridOXINE (VITAMIN B-6) 100 MG tablet Take 100 mg by mouth daily.   12/07/2020 at Unknown time  . ibuprofen (ADVIL) 600 MG tablet Take 1 tablet (600 mg total) by mouth every 6 (six) hours as needed. 30 tablet 0 Unknown at Unknown time  . Multiple Vitamin (MULTIVITAMIN WITH MINERALS) TABS tablet Take 1 tablet by  mouth daily.   Unknown at Unknown time  . naproxen (NAPROSYN) 500 MG tablet Take 1 tablet (500 mg total) by mouth 2 (two) times daily. 30 tablet 0 Unknown at Unknown time  . ondansetron (ZOFRAN) 4 MG tablet Take 1 tablet (4 mg total) by mouth every 6 (six) hours. 20 tablet 0 Unknown at Unknown time  . promethazine (PHENERGAN) 25 MG tablet Take 1 tablet (25 mg total) by mouth every 6 (six) hours as needed for nausea or vomiting. 15 tablet 0 Unknown at Unknown time  . traMADol (ULTRAM) 50 MG tablet Take 1 tablet (50 mg total) by mouth every 6 (six) hours as needed. 10 tablet 0 Unknown at Unknown time   Results for orders placed or performed during the hospital encounter of 12/07/20 (from the past 48 hour(s))  ABO/Rh     Status: None   Collection Time: 12/07/20  9:43 PM  Result Value Ref Range   ABO/RH(D)      O POS Performed at  [facility], [address], [place], [place] [address]   HIV Antibody (routine testing w rflx)     Status: None   Collection Time: 12/07/20  9:43 PM  Result Value Ref Range   HIV Screen 4th Generation wRfx Non Reactive Non Reactive    Comment: Performed at [facility], [address], [place], [place] [address]  Urinalysis, Routine w reflex microscopic     Status: None   Collection Time: 12/07/20  9:50 PM  Result Value Ref Range   Color, Urine YELLOW YELLOW   APPearance CLEAR CLEAR   Specific Gravity, Urine 1.015 1.005 - 1.030   pH 5.0 5.0 - 8.0   Glucose, UA NEGATIVE NEGATIVE mg/dL   Hgb urine dipstick NEGATIVE NEGATIVE   Bilirubin Urine NEGATIVE NEGATIVE   Ketones, ur NEGATIVE NEGATIVE mg/dL   Protein, ur NEGATIVE NEGATIVE mg/dL   Nitrite NEGATIVE NEGATIVE   Leukocytes,Ua NEGATIVE NEGATIVE    Comment: Performed at [facility], [address] [place], [place] [address]  Wet prep, genital     Status: Abnormal   Collection Time: 12/07/20 10:09 PM  Result Value Ref Range   Yeast Wet Prep HPF POC NONE SEEN NONE SEEN   Trich, Wet Prep  NONE SEEN NONE SEEN   Clue Cells Wet Prep HPF POC PRESENT (A) NONE SEEN   WBC, Wet Prep HPF POC MANY (A) NONE SEEN   Sperm NONE SEEN     Comment: Performed at [facility], [address] [place], [place] [address]   [place] Comp Less 14 Wks  Result Date: 12/07/2020 CLINICAL DATA:  Initial evaluation for acute vaginal spotting, cramping. EXAM: OBSTETRIC <14 WK ULTRASOUND TECHNIQUE: Transabdominal ultrasound was performed for evaluation of the gestation as well as the maternal uterus and adnexal regions. COMPARISON:  None. FINDINGS: Intrauterine gestational sac: Single Yolk sac:  Present Embryo:  Present Cardiac Activity: Present Heart Rate: 179 bpm CRL: 17.5 mm   8 w 1 d                  [place] EDC: 07/18/2021 Subchorionic hemorrhage: Small subchorionic hemorrhage measures 3.6 x 1.0 x 2.1 cm. No associated mass effect. Maternal uterus/adnexae: Ovaries within normal limits bilaterally. Small degenerating corpus luteal cyst noted on the left. No adnexal mass or free fluid. IMPRESSION: 1. Single viable intrauterine pregnancy as above, estimated gestational age [redacted] weeks and 1 day by crown-rump length, with ultrasound EDC of 07/18/2021. 2. 3.6 x 1.0 x 2.1 cm subchorionic hemorrhage without associated mass effect. 3. Small degenerating left ovarian corpus luteal cyst. No other acute maternal uterine or adnexal abnormality. Electronically Signed   By: Rise Mu M.D.   On: 12/07/2020 22:36   Review of Systems  Gastrointestinal: Negative for abdominal pain, nausea and vomiting.  Genitourinary: Positive for vaginal bleeding and vaginal discharge.   Physical Exam   Blood pressure 126/77, pulse 86, temperature 98.6 F (37 C), temperature source Oral, resp. rate 16, height 5\' 6"  (1.676 m), weight 73.8 kg, last menstrual period 10/12/2020, SpO2 100 %.  Physical Exam Vitals and nursing note reviewed.  Constitutional:      General: She is not in acute distress.    Appearance: Normal appearance.  She is not ill-appearing, toxic-appearing or diaphoretic.  Eyes:     Pupils: Pupils are equal, round, and reactive to light.  Cardiovascular:     Rate and Rhythm: Normal rate.  Pulmonary:     Effort: Pulmonary  effort is normal.     Breath sounds: Normal breath sounds.  Abdominal:     Palpations: Abdomen is soft.     Tenderness: There is no abdominal tenderness.  Genitourinary:    Comments: Wet prep and GC collected. Scant, pink blood noted on exam  Exam by, Venia Carbon, NP Neurological:     Mental Status: She is alert and oriented to person, place, and time.  Psychiatric:        Behavior: Behavior normal.     MAU Course  Procedures  None  MDM  O positive blood type.  Wet prep & GC HIV, CBC, Hcg, ABO US OB transvaginal   Assessment and Plan   A:  1. Subchorionic hematoma in first trimester, single or unspecified fetus   2. Vaginal bleeding in pregnancy   3. Type O blood, Rh positive   4. Bacterial vaginosis     P:  Discharge home in stable condition Rx: Flagyl  Discussed [place] results in detail with patient Pelvic rest Return to MAU if symptoms worsen  Pooja Camuso, Harolyn Rutherford, NP 12/08/2020 11:05 AM

## 2020-12-07 NOTE — ED Notes (Signed)
Report called to MAU after pt screened by PA.    Pt transported to MAU via wheelchair

## 2020-12-07 NOTE — Discharge Instructions (Signed)

## 2020-12-07 NOTE — MAU Note (Signed)
Pt transferred from ED with c/o vaginal spotting with intermittent pain that started yesterday however became more frequent today.  She states every time she wipes there was some blood on tissue.  Pt reports LMP 10/12/20

## 2020-12-09 LAB — ABO/RH: ABO/RH(D): O POS

## 2021-04-13 ENCOUNTER — Encounter (HOSPITAL_BASED_OUTPATIENT_CLINIC_OR_DEPARTMENT_OTHER): Payer: Self-pay | Admitting: Emergency Medicine

## 2021-04-13 ENCOUNTER — Other Ambulatory Visit: Payer: Self-pay

## 2021-04-13 ENCOUNTER — Emergency Department (HOSPITAL_BASED_OUTPATIENT_CLINIC_OR_DEPARTMENT_OTHER)
Admission: EM | Admit: 2021-04-13 | Discharge: 2021-04-13 | Payer: 59 | Attending: Emergency Medicine | Admitting: Emergency Medicine

## 2021-04-13 DIAGNOSIS — R197 Diarrhea, unspecified: Secondary | ICD-10-CM | POA: Insufficient documentation

## 2021-04-13 DIAGNOSIS — M545 Low back pain, unspecified: Secondary | ICD-10-CM | POA: Insufficient documentation

## 2021-04-13 DIAGNOSIS — R1084 Generalized abdominal pain: Secondary | ICD-10-CM | POA: Diagnosis not present

## 2021-04-13 DIAGNOSIS — O26892 Other specified pregnancy related conditions, second trimester: Secondary | ICD-10-CM | POA: Insufficient documentation

## 2021-04-13 DIAGNOSIS — R112 Nausea with vomiting, unspecified: Secondary | ICD-10-CM | POA: Diagnosis not present

## 2021-04-13 DIAGNOSIS — Z3A26 26 weeks gestation of pregnancy: Secondary | ICD-10-CM | POA: Diagnosis not present

## 2021-04-13 LAB — BASIC METABOLIC PANEL
Anion gap: 7 (ref 5–15)
BUN: 7 mg/dL (ref 6–20)
CO2: 23 mmol/L (ref 22–32)
Calcium: 8.3 mg/dL — ABNORMAL LOW (ref 8.9–10.3)
Chloride: 100 mmol/L (ref 98–111)
Creatinine, Ser: 0.58 mg/dL (ref 0.44–1.00)
GFR, Estimated: >60 mL/min (ref >60)
Glucose, Bld: 90 mg/dL (ref 70–99)
Potassium: 3.6 mmol/L (ref 3.5–5.1)
Sodium: 130 mmol/L — ABNORMAL LOW (ref 135–145)

## 2021-04-13 LAB — HEPATIC FUNCTION PANEL
ALT: 24 U/L (ref 0–44)
AST: 23 U/L (ref 15–41)
Albumin: 2.8 g/dL — ABNORMAL LOW (ref 3.5–5.0)
Alkaline Phosphatase: 61 U/L (ref 38–126)
Bilirubin, Direct: <0.1 mg/dL (ref 0.0–0.2)
Total Bilirubin: 0.3 mg/dL (ref 0.3–1.2)
Total Protein: 7.2 g/dL (ref 6.5–8.1)

## 2021-04-13 LAB — CBC
HCT: 37.4 % (ref 36.0–46.0)
Hemoglobin: 12.6 g/dL (ref 12.0–15.0)
MCH: 31.8 pg (ref 26.0–34.0)
MCHC: 33.7 g/dL (ref 30.0–36.0)
MCV: 94.4 fL (ref 80.0–100.0)
Platelets: 218 10*3/uL (ref 150–400)
RBC: 3.96 MIL/uL (ref 3.87–5.11)
RDW: 11.9 % (ref 11.5–15.5)
WBC: 5.8 10*3/uL (ref 4.0–10.5)
nRBC: 0 % (ref 0.0–0.2)

## 2021-04-13 LAB — TROPONIN I (HIGH SENSITIVITY): Troponin I (High Sensitivity): <2 ng/L (ref <18)

## 2021-04-13 MED ORDER — SODIUM CHLORIDE 0.9 % IV BOLUS
1000.0000 mL | Freq: Once | INTRAVENOUS | Status: AC
  Administered 2021-04-13: 1000 mL via INTRAVENOUS

## 2021-04-13 NOTE — ED Triage Notes (Signed)
Mid back pain and chest pain , denies contractions, vomiting today .

## 2021-04-13 NOTE — Progress Notes (Signed)
`  Received call from [facility] RN. Pt is a G1P0 at 58 1/[redacted] weeks gestation presenting with c/o abd pain, back pain that started last night. Denies vaginal bleeding or leaking of fluid. Says pt gets her care in [facility] with a [facility] MD.

## 2021-04-13 NOTE — ED Provider Notes (Signed)
[facility] EMERGENCY DEPARTMENT Provider Note   CSN: [id number] Arrival date & time: 04/13/21  1057     History Chief Complaint  Patient presents with  . Back Pain    26 wks preg    Gloria Gonzales is a 30 y.o. female.  HPI    30 year old female G1 at 47 weeks presents with concern for back pain and abdominal pain.  Describes middle back pain with radiation to the front bilaterally which began last night.  The pain is severe, like a burning in her back, however every hour she will have episodes of sharper pain with radiation from her back around her abdomen and "tightening".  Reports the pain comes around both sides.  She has had nausea and vomiting today.  Also reports episodes of diarrhea this morning.  No black or bloody stools.  Denies vaginal bleeding, leakage of fluid, or vaginal discharge.  She feels baby moving around normally.  On my history, denies fever, chest pain or shortness of breath.  Denies cough.  No known sick contacts.   Past Medical History:  Diagnosis Date  . Anxiety     There are no problems to display for this patient.   History reviewed. No pertinent surgical history.   OB History    Gravida  1   Para      Term      Preterm      AB      Living        SAB      IAB      Ectopic      Multiple      Live Births              Family History  Problem Relation Age of Onset  . Hypertension Mother   . Hypertension Father   . Diabetes Father   . Stroke Father     Social History   Tobacco Use  . Smoking status: Never Smoker  . Smokeless tobacco: Never Used  Vaping Use  . Vaping Use: Never used  Substance Use Topics  . Alcohol use: Never  . Drug use: Not Currently    Types: Marijuana    Comment: last used 10/21    Home Medications Prior to Admission medications   Medication Sig Start Date End Date Taking? Authorizing Provider  Multiple Vitamin (MULTIVITAMIN WITH MINERALS) TABS tablet Take 1 tablet by mouth  daily.    [provider]  Prenatal Vit-Fe Fumarate-FA (PRENATAL VITAMIN PO) Take 1 tablet by mouth daily.    [provider]  promethazine (PHENERGAN) 25 MG tablet Take 1 tablet (25 mg total) by mouth every 6 (six) hours as needed for nausea or vomiting. 04/21/20   Horton, Mayer Masker, MD  pyridOXINE (VITAMIN B-6) 100 MG tablet Take 100 mg by mouth daily.    [provider]    Allergies    Shrimp [shellfish allergy] and Clonazepam  Review of Systems   Review of Systems  Constitutional: Negative for fever.  Respiratory: Negative for cough and shortness of breath.   Cardiovascular: Negative for chest pain.  Gastrointestinal: Positive for abdominal pain, diarrhea, nausea and vomiting.  Genitourinary: Negative for dysuria.  Skin: Negative for rash.    Physical Exam Updated Vital Signs BP 118/74 (BP Location: Right Arm)   Pulse 97   Temp 98.4 F (36.9 C) (Oral)   Resp 20   Ht 5\' 6"  (1.676 m)   Wt 96.2 kg   LMP 10/12/2020  SpO2 100%   BMI 34.22 kg/m   Physical Exam Vitals and nursing note reviewed.  Constitutional:      General: She is not in acute distress.    Appearance: She is well-developed. She is not diaphoretic.  HENT:     Head: Normocephalic and atraumatic.  Eyes:     Conjunctiva/sclera: Conjunctivae normal.  Cardiovascular:     Rate and Rhythm: Normal rate and regular rhythm.     Heart sounds: Normal heart sounds. No murmur heard. No friction rub. No gallop.   Pulmonary:     Effort: Pulmonary effort is normal. No respiratory distress.     Breath sounds: Normal breath sounds. No wheezing or rales.  Abdominal:     General: There is no distension.     Palpations: Abdomen is soft.     Tenderness: There is no abdominal tenderness. There is no guarding.     Comments: Gravid Slight discomfort with palpation, no significant tenderness, neg murphy's, no specific RUQ tenderness   Musculoskeletal:        General: No tenderness.     Cervical  back: Normal range of motion.  Skin:    General: Skin is warm and dry.     Findings: No erythema or rash.  Neurological:     Mental Status: She is alert and oriented to person, place, and time.     ED Results / Procedures / Treatments   Labs (all labs ordered are listed, but only abnormal results are displayed) Labs Reviewed  BASIC METABOLIC PANEL - Abnormal; Notable for the following components:      Result Value   Sodium 130 (*)    Calcium 8.3 (*)    All other components within normal limits  HEPATIC FUNCTION PANEL - Abnormal; Notable for the following components:   Albumin 2.8 (*)    All other components within normal limits  URINE CULTURE  CBC  URINALYSIS, ROUTINE W REFLEX MICROSCOPIC  TROPONIN I (HIGH SENSITIVITY)  TROPONIN I (HIGH SENSITIVITY)    EKG EKG Interpretation  Date/Time:  Sunday April 13 2021 11:45:18 EDT Ventricular Rate:  118 PR Interval:  136 QRS Duration: 68 QT Interval:  312 QTC Calculation: 437 R Axis:   41 Text Interpretation: Sinus tachycardia Otherwise normal ECG Since prior ECG< TW inversionsmay benew although previous ECG withartifact Confirmed by ,  ([id number]) on 04/13/2021 3:40:51 PM   Radiology No results found.  Procedures Procedures   Medications Ordered in ED Medications  sodium chloride 0.9 % bolus 1,000 mL (0 mLs Intravenous Stopped 04/13/21 1347)    ED Course  I have reviewed the triage vital signs and the nursing notes.  Pertinent labs & imaging results that were available during my care of the patient were reviewed by me and considered in my medical decision making (see chart for details).    MDM Rules/Calculators/A&P                          29  year old female G1 at 101 weeks presents with concern for back pain and abdominal pain.  Differential diagnosis includes nephrolithiasis, cholecystitis, pyelonephritis, gastroenteritis, contractions.  Given pain bilateral, have low suspicion for nephrolithiasis  or cholecystitis, and she does not have significant right upper quadrant tenderness on exam. Urinalysis is pending at time of transfer of care to [facility].  Continue to consider possible gastroenteritis, pyelonephritis, or contractions.  Given her description of episodes of back pain and abdominal pain discussed with [facility]  who is on-call for patient's OB [facility].  Had previously discussed with Dr. Macon Large of OB for [facility] and reports the NICU is on diversion at [facility].    FHT is reassuring and no sign of uterine contractions during our period of evaluation, however given her pain feel OB evaluation for cervical check to ensure no signs of early labor and continued monitoring in setting of OB appropriate. Transferred to [facility] with Dr. Loreta Ave the accepting physician.    Final Clinical Impression(s) / ED Diagnoses Final diagnoses:  Generalized abdominal pain    Rx / DC Orders ED Discharge Orders    None       Alvira Monday, MD 04/13/21 1550

## 2021-04-13 NOTE — ED Notes (Signed)
Report provided to [facility] for transport. 

## 2021-04-13 NOTE — ED Notes (Signed)
Report provided to Amy RN L&D, awaiting transport info from [facility] ground transport

## 2021-04-13 NOTE — Progress Notes (Signed)
Spoke with Dr. Dalene Seltzer. She plans to transfer pt to [facility] for further evaluation. FHR tracing reactive for her gestational age. No uc's noted. Dr. Macon Large says that she has already spoken to Dr. Dalene Seltzer.

## 2021-04-13 NOTE — ED Notes (Signed)
Call to OB rapid response, monitoring initiated.  Dr Dalene Seltzer at bedside

## 2021-04-13 NOTE — ED Notes (Signed)
Pt comfortable during monitoring, occasional twinges of discomfort

## 2021-04-13 NOTE — ED Notes (Signed)
Pt resting comfortably.  Mom at bedside. Agree with plan to transfer to [facility] to L&D for ongoing monitoring.  Dr Loreta Ave accepting.  Arranging transport.

## 2022-04-26 IMAGING — CR DG SHOULDER 2+V*R*
3 series · 3 of 3 positions shown · non-contrast
Comparison: None.

CLINICAL DATA: Shoulder injury

EXAM:
RIGHT SHOULDER - 2+ VIEW

[x shoulder axillary right]
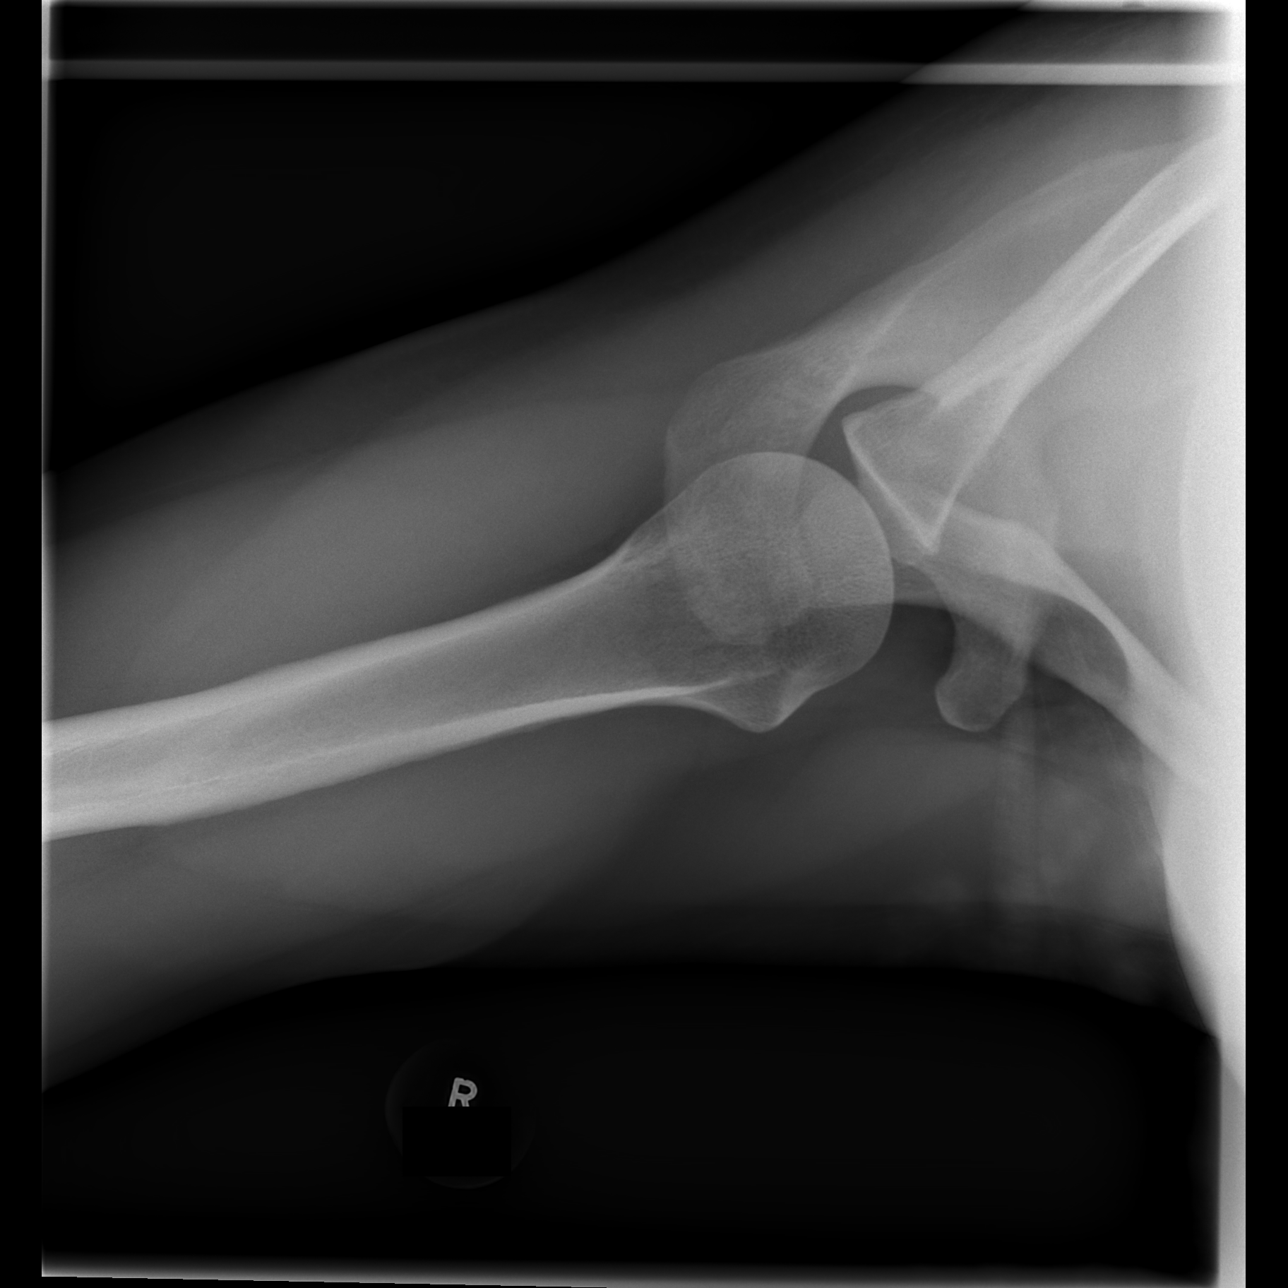

[w shoulder grashey right]
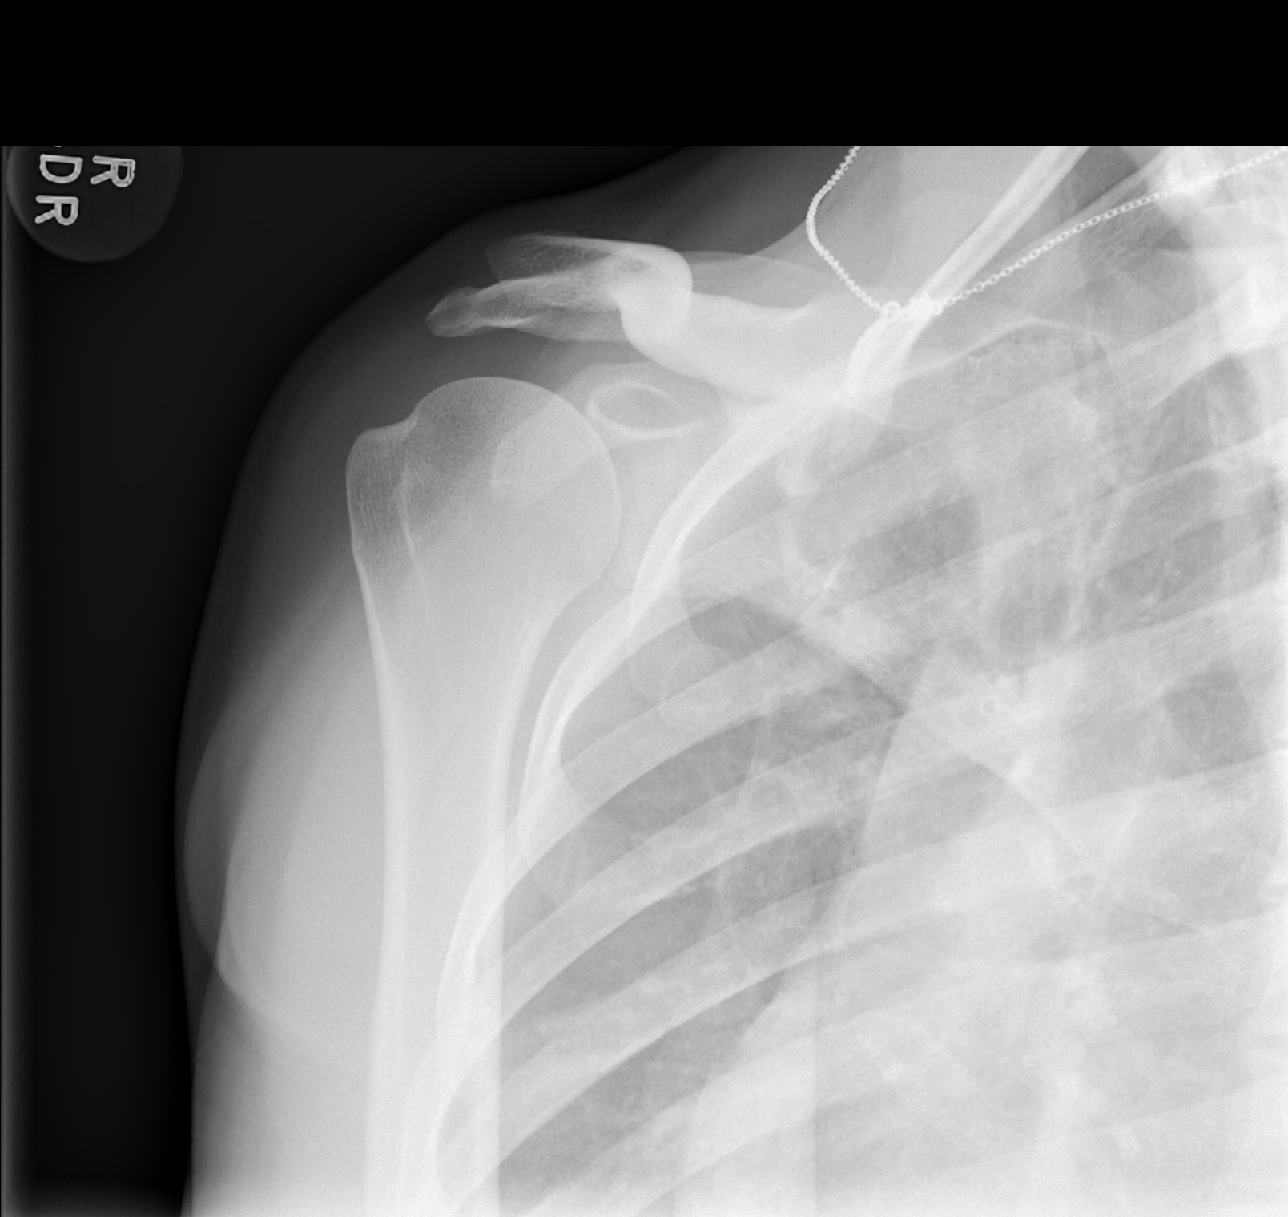

[w shoulder y view right]
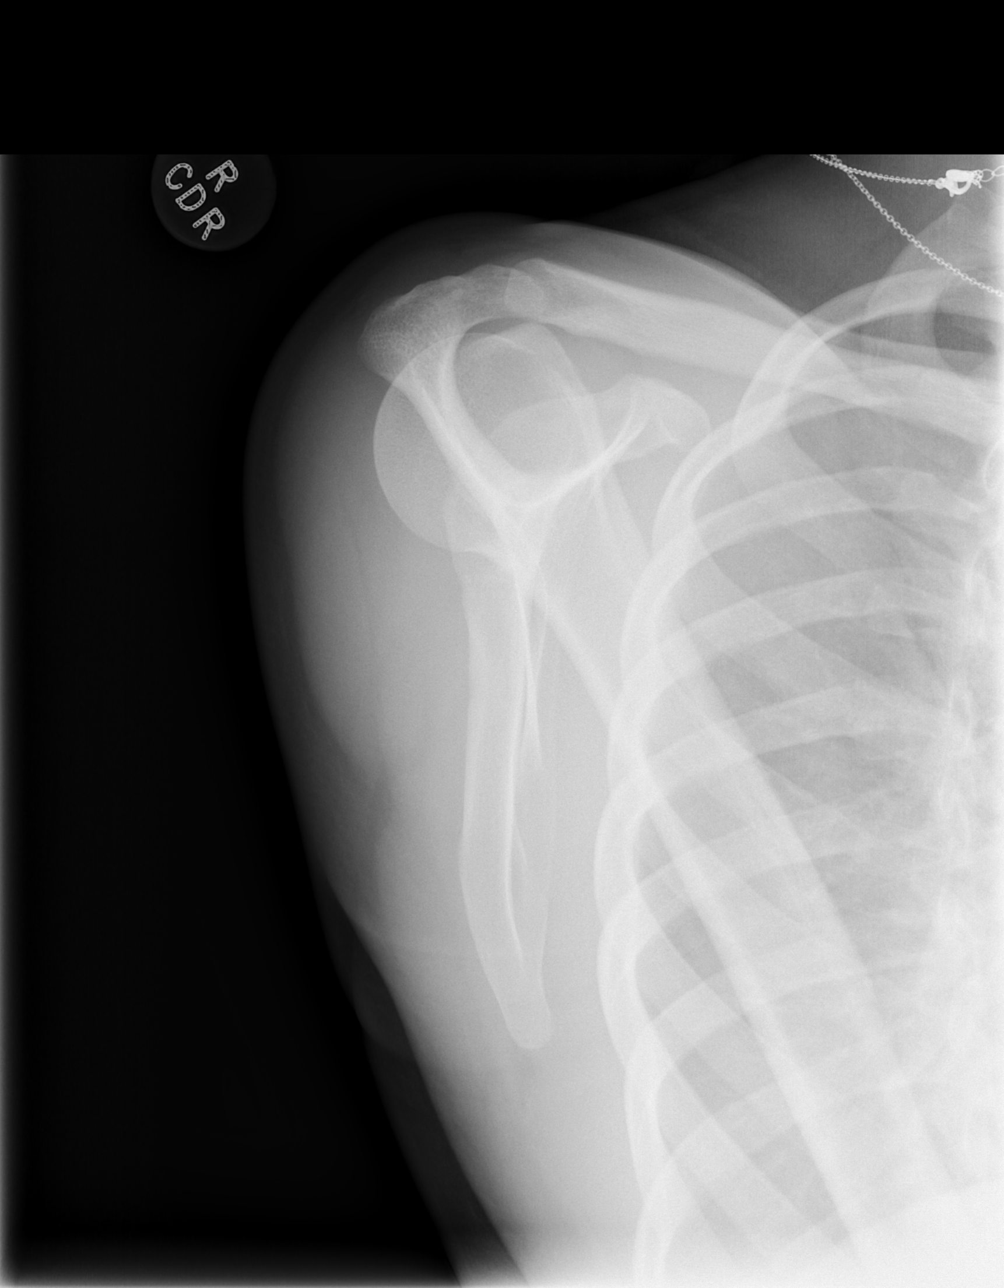

[3 of 3 positions shown; findings below may reference images not displayed]

FINDINGS: There is no evidence of fracture or dislocation. There is no
evidence of arthropathy or other focal bone abnormality. Soft
tissues are unremarkable.
IMPRESSION: Negative.

## 2022-10-19 IMAGING — US US OB COMP LESS 14 WK
1 series · 15 of 24 positions shown · non-contrast
Comparison: None.

CLINICAL DATA: Initial evaluation for acute vaginal spotting,
cramping.

EXAM:
OBSTETRIC <14 WK ULTRASOUND
TECHNIQUE: Transabdominal ultrasound was performed for evaluation of the
gestation as well as the maternal uterus and adnexal regions.

[Series 1: us ob comp less 14 wk · 15 of 24 slices shown]
[im 1/24]
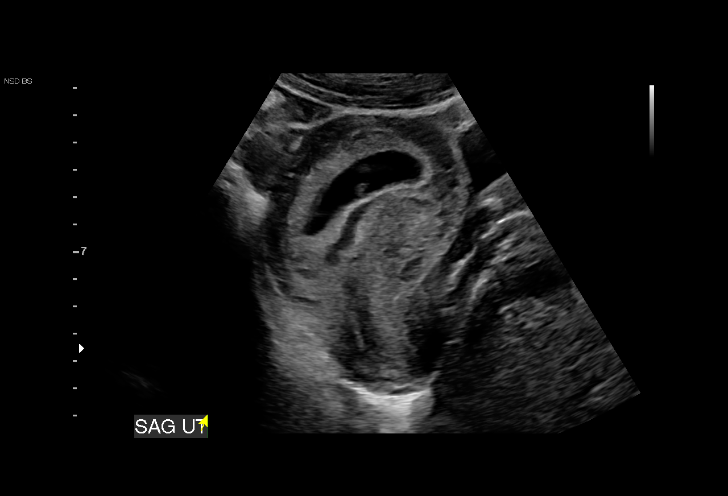
[im 3/24]
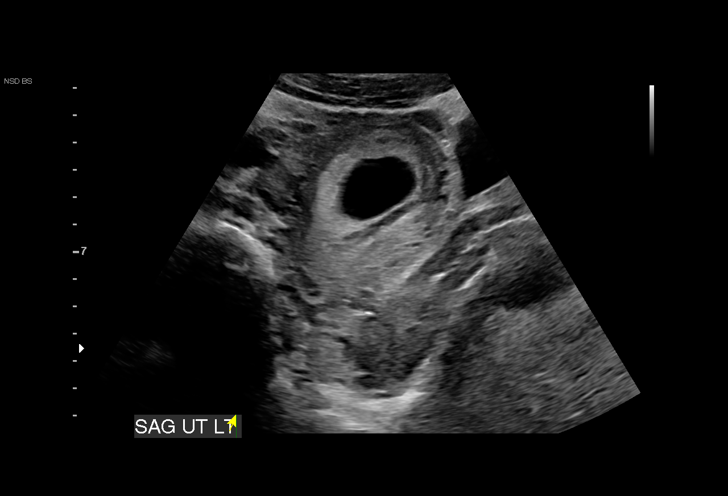
[im 5/24]
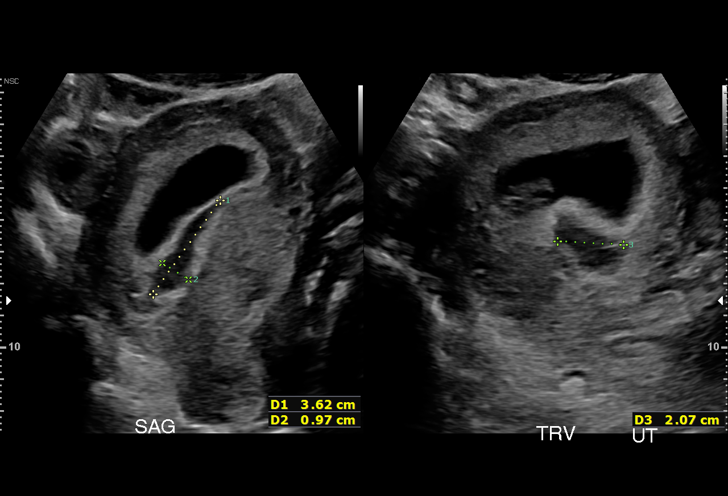
[im 6/24]
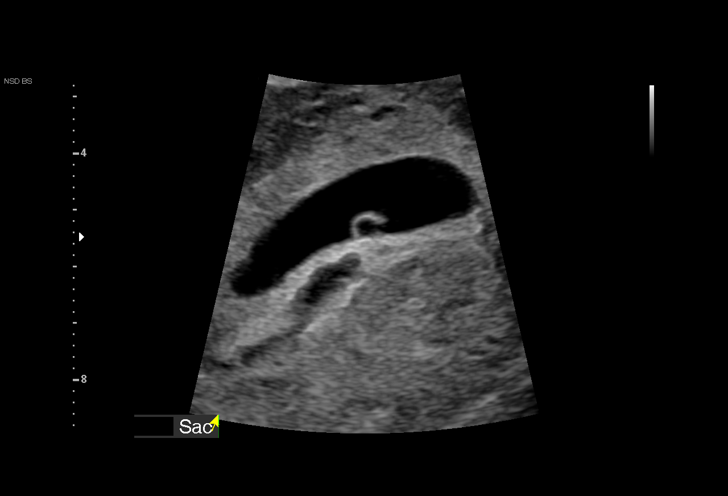
[im 8/24]
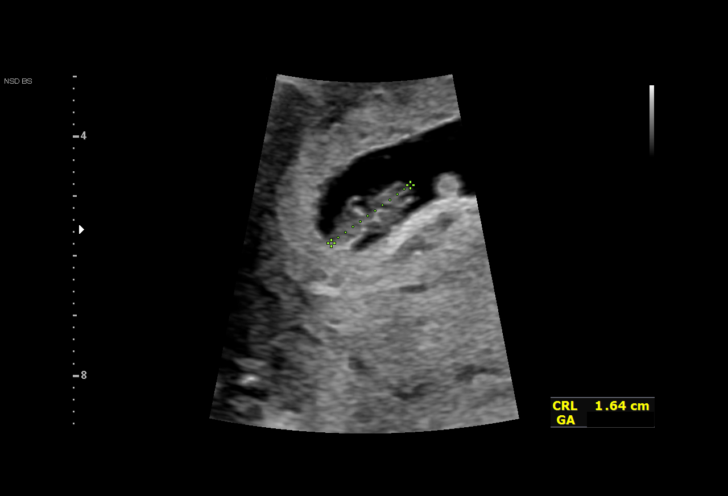
[im 9/24]
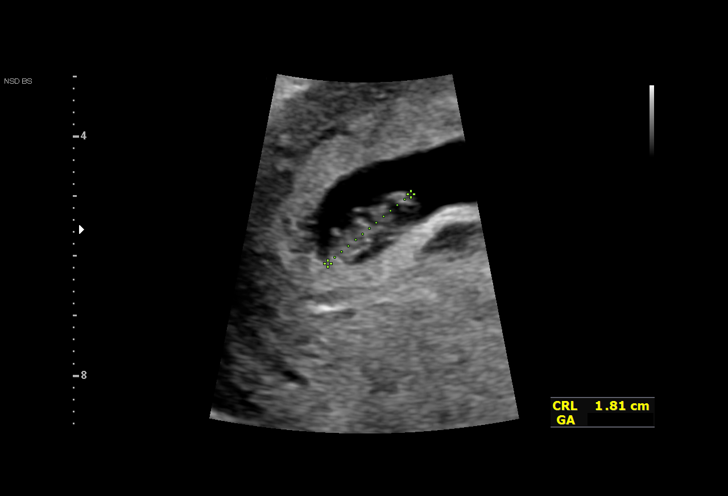
[im 11/24]
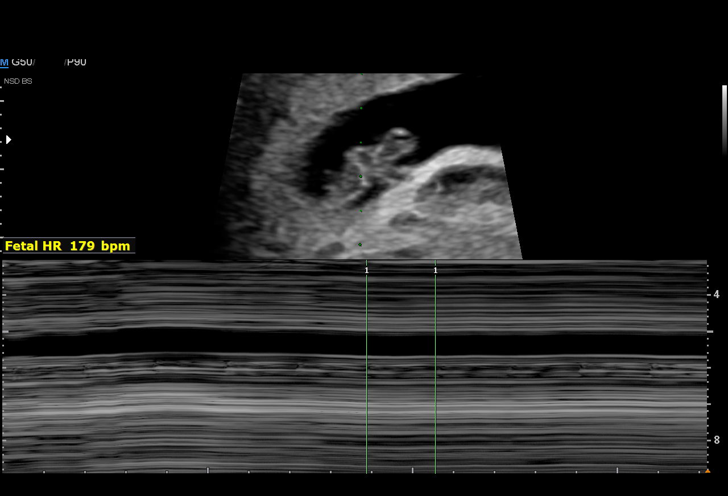
[im 13/24]
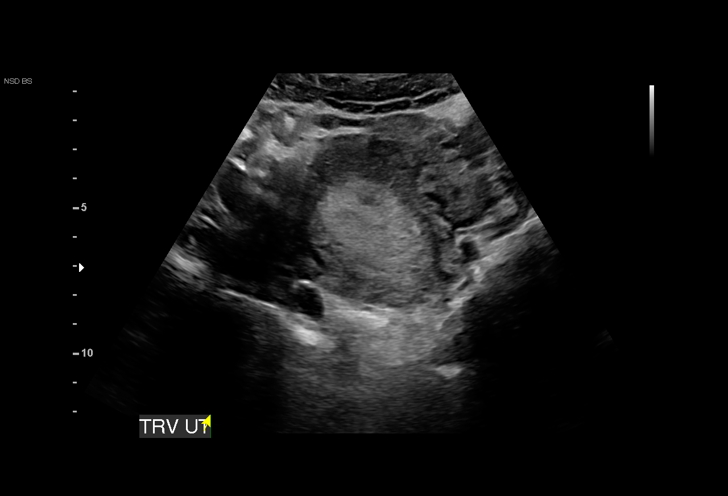
[im 14/24]
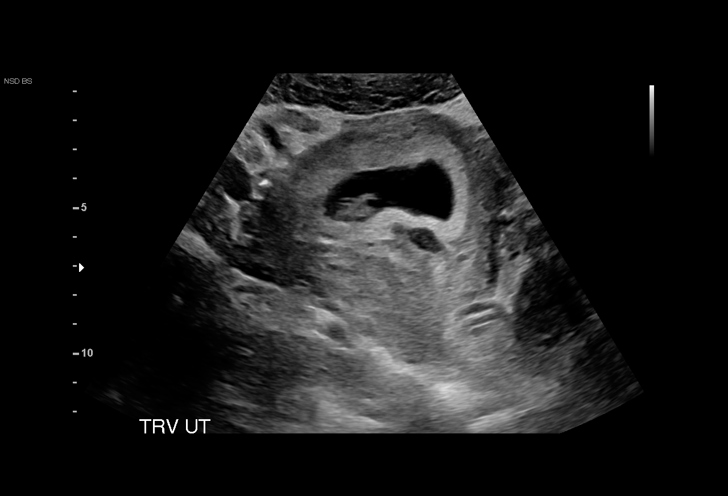
[im 16/24]
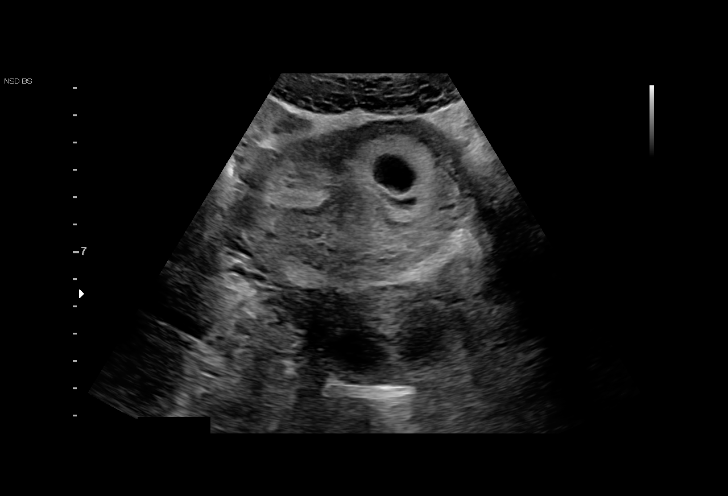
[im 17/24]
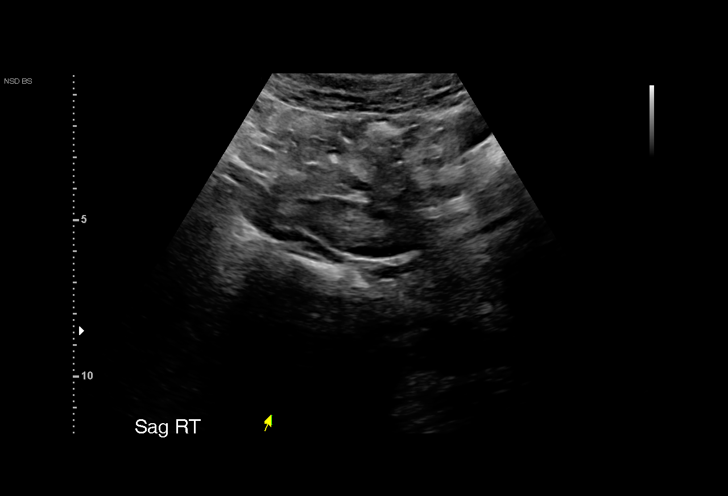
[im 19/24]
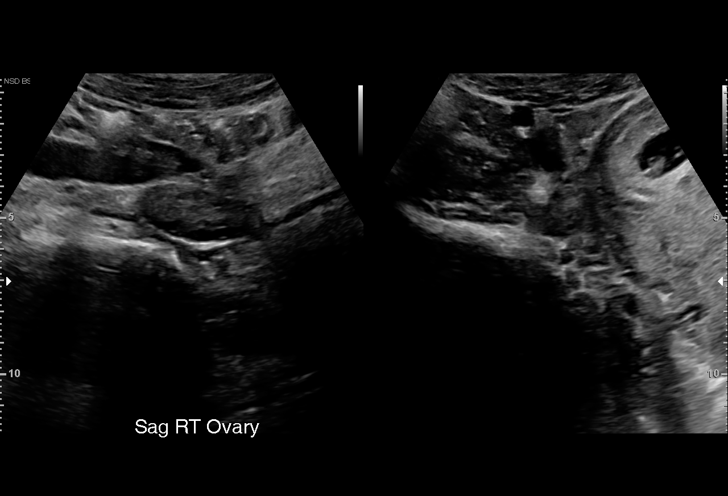
[im 21/24]
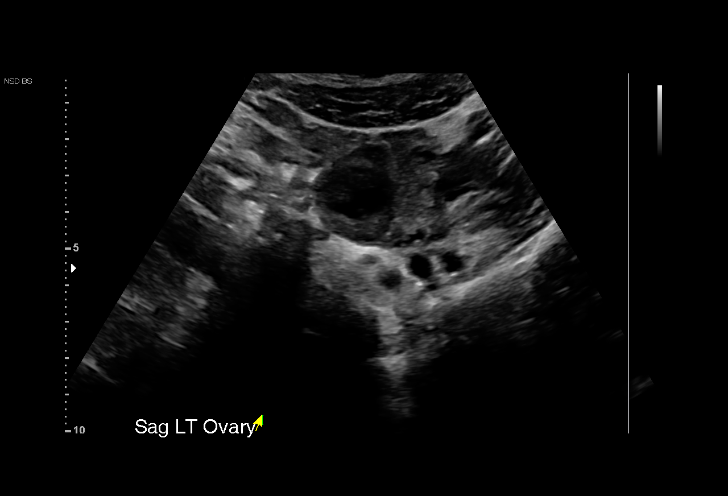
[im 22/24]
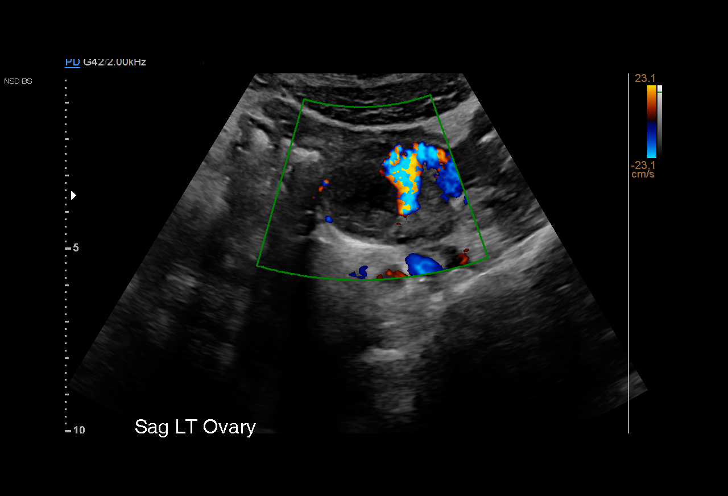
[im 24/24]
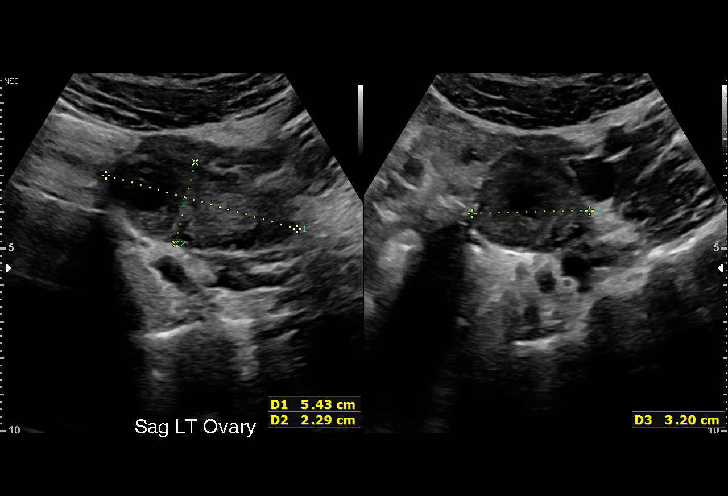

[15 of 24 positions shown; findings below may reference images not displayed]

FINDINGS: Intrauterine gestational sac: Single

Yolk sac:  Present

Embryo:  Present

Cardiac Activity: Present

Heart Rate: 179 bpm

CRL: 17.5 mm   8 w 1 d                  US EDC: 07/18/2021

Subchorionic hemorrhage: Small subchorionic hemorrhage measures
x 1.0 x 2.1 cm. No associated mass effect.

Maternal uterus/adnexae: Ovaries within normal limits bilaterally.
Small degenerating corpus luteal cyst noted on the left. No adnexal
mass or free fluid.
IMPRESSION: 1. Single viable intrauterine pregnancy as above, estimated
gestational age 8 weeks and 1 day by crown-rump length, with
ultrasound EDC of 07/18/2021.
2. 3.6 x 1.0 x 2.1 cm subchorionic hemorrhage without associated
mass effect.
3. Small degenerating left ovarian corpus luteal cyst. No other
acute maternal uterine or adnexal abnormality.

## 2022-10-26 ENCOUNTER — Inpatient Hospital Stay (HOSPITAL_COMMUNITY)
Admission: AD | Admit: 2022-10-26 | Discharge: 2022-10-27 | Disposition: A | Discharge status: Home / Self Care | Payer: Medicaid Other | Attending: Obstetrics & Gynecology | Admitting: Obstetrics & Gynecology

## 2022-10-26 DIAGNOSIS — B379 Candidiasis, unspecified: Secondary | ICD-10-CM

## 2022-10-26 DIAGNOSIS — B3749 Other urogenital candidiasis: Secondary | ICD-10-CM | POA: Insufficient documentation

## 2022-10-26 DIAGNOSIS — O210 Mild hyperemesis gravidarum: Secondary | ICD-10-CM

## 2022-10-26 DIAGNOSIS — Z3A28 28 weeks gestation of pregnancy: Secondary | ICD-10-CM

## 2022-10-26 DIAGNOSIS — O98813 Other maternal infectious and parasitic diseases complicating pregnancy, third trimester: Secondary | ICD-10-CM | POA: Insufficient documentation

## 2022-10-27 ENCOUNTER — Encounter (HOSPITAL_COMMUNITY): Payer: Self-pay

## 2022-10-27 DIAGNOSIS — O210 Mild hyperemesis gravidarum: Secondary | ICD-10-CM

## 2022-10-27 DIAGNOSIS — B379 Candidiasis, unspecified: Secondary | ICD-10-CM | POA: Diagnosis not present

## 2022-10-27 DIAGNOSIS — Z3A28 28 weeks gestation of pregnancy: Secondary | ICD-10-CM

## 2022-10-27 DIAGNOSIS — B3749 Other urogenital candidiasis: Secondary | ICD-10-CM | POA: Diagnosis not present

## 2022-10-27 DIAGNOSIS — O98813 Other maternal infectious and parasitic diseases complicating pregnancy, third trimester: Secondary | ICD-10-CM | POA: Diagnosis not present

## 2022-10-27 LAB — URINALYSIS, ROUTINE W REFLEX MICROSCOPIC
Glucose, UA: NEGATIVE mg/dL
Hgb urine dipstick: NEGATIVE
Ketones, ur: 80 mg/dL — AB
Nitrite: NEGATIVE
Protein, ur: 100 mg/dL — AB
Specific Gravity, Urine: 1.035 — ABNORMAL HIGH (ref 1.005–1.030)
pH: 5 (ref 5.0–8.0)

## 2022-10-27 LAB — WET PREP, GENITAL
Clue Cells Wet Prep HPF POC: NONE SEEN
Sperm: NONE SEEN
Trich, Wet Prep: NONE SEEN
WBC, Wet Prep HPF POC: >=10 — AB (ref <10)

## 2022-10-27 LAB — GC/CHLAMYDIA PROBE AMP (~~LOC~~) NOT AT ARMC
Chlamydia: NEGATIVE
Comment: NEGATIVE
Comment: NORMAL
Neisseria Gonorrhea: NEGATIVE

## 2022-10-27 MED ORDER — SCOPOLAMINE 1 MG/3DAYS TD PT72
1.0000 | MEDICATED_PATCH | Freq: Once | TRANSDERMAL | Status: DC
  Administered 2022-10-27: 1.5 mg via TRANSDERMAL
  Filled 2022-10-27: qty 1

## 2022-10-27 MED ORDER — SODIUM CHLORIDE 0.9 % IV SOLN
25.0000 mg | Freq: Once | INTRAVENOUS | Status: AC
  Administered 2022-10-27: 25 mg via INTRAVENOUS
  Filled 2022-10-27: qty 1

## 2022-10-27 MED ORDER — ONDANSETRON HCL 4 MG/2ML IJ SOLN
4.0000 mg | Freq: Once | INTRAMUSCULAR | Status: AC
  Administered 2022-10-27: 4 mg via INTRAVENOUS
  Filled 2022-10-27: qty 2

## 2022-10-27 MED ORDER — FAMOTIDINE IN NACL 20-0.9 MG/50ML-% IV SOLN
20.0000 mg | Freq: Once | INTRAVENOUS | Status: AC
  Administered 2022-10-27: 20 mg via INTRAVENOUS
  Filled 2022-10-27: qty 50

## 2022-10-27 MED ORDER — LACTATED RINGERS IV BOLUS
1000.0000 mL | Freq: Once | INTRAVENOUS | Status: AC
  Administered 2022-10-27: 1000 mL via INTRAVENOUS

## 2022-10-27 MED ORDER — TERCONAZOLE 0.4 % VA CREA: 1.0000 | TOPICAL_CREAM | Freq: Every day | VAGINAL | 0 refills | Status: AC

## 2022-10-27 MED ORDER — SCOPOLAMINE 1 MG/3DAYS TD PT72: 1.0000 | MEDICATED_PATCH | TRANSDERMAL | 12 refills | Status: AC

## 2022-10-27 NOTE — MAU Provider Note (Signed)
History     CSN: [id number]  Arrival date and time: 10/26/22 2346   Event Date/Time   First Provider Initiated Contact with Patient 10/27/22 0016      Chief Complaint  Patient presents with   Emesis   Abdominal Pain   Angela Vazguez , a  31 y.o. G2P0101 at [redacted]w[redacted]d presents to MAU via EMS with complaints of Nausea and vomiting that has been on-going for the last 48-72 hours. Patient says she's has vomiting since beginning of pregnancy but in the last few says it has been "excessive and non-stop".  Patient states she attempted PO Zofran as prescribed without relief. She also noted lower abdominal pain, but believes its from throwing up so forcefully for so long. Endorses some "sporadic period like cramping, but states not currently feeling pain. She also noted some abnormal yellow/pink vaginal discharge. Denies urinary symptoms and back pain. Denies vaginal bleeding, leaking of fluid. Endorses positive fetal movement.          Emesis  Associated symptoms include abdominal pain. Pertinent negatives include no chest pain, chills, diarrhea, dizziness, fever or headaches.  Abdominal Pain Associated symptoms include nausea and vomiting. Pertinent negatives include no constipation, diarrhea, dysuria, fever or headaches.    OB History     Gravida  2   Para  1   Term      Preterm  1   AB      Living  1      SAB      IAB      Ectopic      Multiple      Live Births  1           Past Medical History:  Diagnosis Date   Anxiety     History reviewed. No pertinent surgical history.  Family History  Problem Relation Age of Onset   Hypertension Mother    Hypertension Father    Diabetes Father    Stroke Father     Social History   Tobacco Use   Smoking status: Never   Smokeless tobacco: Never  Vaping Use   Vaping Use: Never used  Substance Use Topics   Alcohol use: Never   Drug use: Not Currently    Types: Marijuana    Comment: last used 10/21     Allergies:  Allergies  Allergen Reactions   Shrimp [Shellfish Allergy]    Clonazepam Itching and Rash    Medications Prior to Admission  Medication Sig Dispense Refill Last Dose   ondansetron (ZOFRAN-ODT) 4 MG disintegrating tablet Take 4 mg by mouth every 8 (eight) hours as needed for nausea or vomiting.   10/27/2022   Prenatal Vit-Fe Fumarate-FA (PRENATAL VITAMIN PO) Take 1 tablet by mouth daily.   10/27/2022   Multiple Vitamin (MULTIVITAMIN WITH MINERALS) TABS tablet Take 1 tablet by mouth daily.      promethazine (PHENERGAN) 25 MG tablet Take 1 tablet (25 mg total) by mouth every 6 (six) hours as needed for nausea or vomiting. 15 tablet 0    pyridOXINE (VITAMIN B-6) 100 MG tablet Take 100 mg by mouth daily.       Review of Systems  Constitutional:  Negative for chills, fatigue and fever.  Eyes:  Negative for pain and visual disturbance.  Respiratory:  Negative for apnea, shortness of breath and wheezing.   Cardiovascular:  Negative for chest pain and palpitations.  Gastrointestinal:  Positive for abdominal pain, nausea and vomiting. Negative for constipation and diarrhea.  Genitourinary:  Positive for vaginal discharge. Negative for difficulty urinating, dysuria, pelvic pain, vaginal bleeding and vaginal pain.  Musculoskeletal:  Negative for back pain.  Neurological:  Negative for dizziness, seizures, syncope, weakness, light-headedness and headaches.  Psychiatric/Behavioral:  Negative for suicidal ideas.    Physical Exam   Blood pressure (!) 107/55, pulse 86, temperature 97.7 F (36.5 C), temperature source Oral, resp. rate 17, last menstrual period 04/13/2022, SpO2 97 %, unknown if currently breastfeeding.  Physical Exam Vitals and nursing note reviewed.  Constitutional:      General: She is in acute distress.     Appearance: Normal appearance. She is ill-appearing.  HENT:     Head: Normocephalic.  Cardiovascular:     Rate and Rhythm: Normal rate and regular  rhythm.  Pulmonary:     Effort: Pulmonary effort is normal.  Abdominal:     General: Bowel sounds are decreased.     Tenderness: There is no abdominal tenderness.  Musculoskeletal:     Cervical back: Normal range of motion.  Skin:    General: Skin is warm and dry.     Capillary Refill: Capillary refill takes less than 2 seconds.  Neurological:     Mental Status: She is alert and oriented to person, place, and time.  Psychiatric:        Mood and Affect: Mood normal.    FHT: 135bpm with moderate variability. 10x10 accels, occasional variables. (Appropriate for gestational age.) Toco: Quiet   MAU Course  Procedures Orders Placed This Encounter  Procedures   Culture, OB Urine   Urinalysis, Routine w reflex microscopic Urine, Clean Catch   Insert peripheral IV   Meds ordered this encounter  Medications   lactated ringers bolus 1,000 mL   famotidine (PEPCID) IVPB 20 mg premix   scopolamine (TRANSDERM-SCOP) 1 MG/3DAYS 1.5 mg   promethazine (PHENERGAN) 25 mg in sodium chloride 0.9 % 50 mL IVPB   ondansetron (ZOFRAN) injection 4 mg   lactated ringers bolus 1,000 mL   Results for orders placed or performed during the hospital encounter of 10/26/22 (from the past 24 hour(s))  Urinalysis, Routine w reflex microscopic Urine, Clean Catch     Status: Abnormal   Collection Time: 10/27/22 12:05 AM  Result Value Ref Range   Color, Urine AMBER (A) YELLOW   APPearance HAZY (A) CLEAR   Specific Gravity, Urine 1.035 (H) 1.005 - 1.030   pH 5.0 5.0 - 8.0   Glucose, UA NEGATIVE NEGATIVE mg/dL   Hgb urine dipstick NEGATIVE NEGATIVE   Bilirubin Urine SMALL (A) NEGATIVE   Ketones, ur 80 (A) NEGATIVE mg/dL   Protein, ur 100 (A) NEGATIVE mg/dL   Nitrite NEGATIVE NEGATIVE   Leukocytes,Ua MODERATE (A) NEGATIVE   RBC / HPF 0-5 0 - 5 RBC/hpf   WBC, UA 21-50 0 - 5 WBC/hpf   Bacteria, UA FEW (A) NONE SEEN   Squamous Epithelial / LPF 6-10 0 - 5   Mucus PRESENT     MDM - UA reflexed to Culture,  2nd dose of IV fluids provided with 80 Ketones 100 of Protein  .  - Patient states she has been able to rest and notes "feeling a little better."   - Upon reassessment patient still actively vomiting. IV Zofran ordered  - @ 4782- Patient resting well. No episodes of vomiting since IV Zofran.  - Wet prep positive for yeast.  - PO challenge successful  - Plan for discharge.   Assessment and Plan   1. Hyperemesis affecting pregnancy,  antepartum   2. [redacted] weeks gestation of pregnancy   3. Yeast infection    - Reviewed worsening signs and return symptoms.  - Hyperemesis in pregnancy reviewed with patient and discussed at home comfort measures.  - Rx for Pepcid and Scop patch sent to outpatient pharmacy. - Also reviewed findings of yeast infection. Rx for [employer] sent to pharmacy.  - Preterm labor precautions reviewed.  - FHT appropriate for gestational age upon discharge.  - Patient discharged home in stable condition and may return to MAU as needed.    Claudette Head, MSN CNM  10/27/2022, 12:16 AM

## 2022-10-27 NOTE — MAU Note (Signed)
..  Gloria Gonzales is a 31 y.o. at [redacted]w[redacted]d here in MAU reporting: nausea and vomiting and it has gotten worse in the past 72 hours. Called OB and got refill for zofran and it did not help. Reports lower abdominal pain that cramping and tightening or possibly like she pulled something from vomiting. Reports  she notices her underwear has been more wet these last few days and has pink/yellow discharge. Last zofran dose was around 9pm  Pain score: 7/10 ASU:ORVIFBP in room 140's Lab orders placed from triage:  UA

## 2022-10-28 LAB — CULTURE, OB URINE

## 2023-06-20 ENCOUNTER — Other Ambulatory Visit: Payer: Self-pay

## 2023-06-20 ENCOUNTER — Emergency Department (HOSPITAL_BASED_OUTPATIENT_CLINIC_OR_DEPARTMENT_OTHER)
Admission: EM | Admit: 2023-06-20 | Discharge: 2023-06-21 | Disposition: A | Discharge status: Home / Self Care | Payer: Medicaid Other | Attending: Emergency Medicine | Admitting: Emergency Medicine

## 2023-06-20 DIAGNOSIS — R112 Nausea with vomiting, unspecified: Secondary | ICD-10-CM | POA: Insufficient documentation

## 2023-06-20 DIAGNOSIS — Z1152 Encounter for screening for COVID-19: Secondary | ICD-10-CM | POA: Insufficient documentation

## 2023-06-20 DIAGNOSIS — R1013 Epigastric pain: Secondary | ICD-10-CM | POA: Insufficient documentation

## 2023-06-20 DIAGNOSIS — R935 Abnormal findings on diagnostic imaging of other abdominal regions, including retroperitoneum: Secondary | ICD-10-CM | POA: Diagnosis not present

## 2023-06-20 LAB — COMPREHENSIVE METABOLIC PANEL
ALT: 35 U/L (ref 0–44)
AST: 22 U/L (ref 15–41)
Albumin: 4.4 g/dL (ref 3.5–5.0)
Alkaline Phosphatase: 51 U/L (ref 38–126)
Anion gap: 10 (ref 5–15)
BUN: 13 mg/dL (ref 6–20)
CO2: 26 mmol/L (ref 22–32)
Calcium: 9.2 mg/dL (ref 8.9–10.3)
Chloride: 99 mmol/L (ref 98–111)
Creatinine, Ser: 0.99 mg/dL (ref 0.44–1.00)
GFR, Estimated: >60 mL/min (ref >60)
Glucose, Bld: 113 mg/dL — ABNORMAL HIGH (ref 70–99)
Potassium: 4 mmol/L (ref 3.5–5.1)
Sodium: 135 mmol/L (ref 135–145)
Total Bilirubin: 1.6 mg/dL — ABNORMAL HIGH (ref 0.3–1.2)
Total Protein: 8.3 g/dL — ABNORMAL HIGH (ref 6.5–8.1)

## 2023-06-20 LAB — CBC
HCT: 41 % (ref 36.0–46.0)
Hemoglobin: 13.7 g/dL (ref 12.0–15.0)
MCH: 31.6 pg (ref 26.0–34.0)
MCHC: 33.4 g/dL (ref 30.0–36.0)
MCV: 94.5 fL (ref 80.0–100.0)
Platelets: 299 10*3/uL (ref 150–400)
RBC: 4.34 MIL/uL (ref 3.87–5.11)
RDW: 12.5 % (ref 11.5–15.5)
WBC: 6.8 10*3/uL (ref 4.0–10.5)
nRBC: 0 % (ref 0.0–0.2)

## 2023-06-20 LAB — WET PREP, GENITAL
Sperm: NONE SEEN
Trich, Wet Prep: NONE SEEN
WBC, Wet Prep HPF POC: <10 (ref <10)
Yeast Wet Prep HPF POC: NONE SEEN

## 2023-06-20 LAB — RESP PANEL BY RT-PCR (RSV, FLU A&B, COVID)  RVPGX2
Influenza A by PCR: NEGATIVE
Influenza B by PCR: NEGATIVE
Resp Syncytial Virus by PCR: NEGATIVE
SARS Coronavirus 2 by RT PCR: NEGATIVE

## 2023-06-20 LAB — LIPASE, BLOOD: Lipase: 29 U/L (ref 11–51)

## 2023-06-20 MED ORDER — PANTOPRAZOLE SODIUM 40 MG IV SOLR
40.0000 mg | Freq: Once | INTRAVENOUS | Status: AC
  Administered 2023-06-20: 40 mg via INTRAVENOUS
  Filled 2023-06-20: qty 10

## 2023-06-20 MED ORDER — ONDANSETRON HCL 4 MG/2ML IJ SOLN
4.0000 mg | Freq: Once | INTRAMUSCULAR | Status: AC
  Administered 2023-06-20: 4 mg via INTRAVENOUS
  Filled 2023-06-20: qty 2

## 2023-06-20 MED ORDER — SODIUM CHLORIDE 0.9 % IV BOLUS
1000.0000 mL | Freq: Once | INTRAVENOUS | Status: AC
  Administered 2023-06-20: 1000 mL via INTRAVENOUS

## 2023-06-20 NOTE — ED Triage Notes (Signed)
Pt arrives with reports of middle abdominal pain for the last few days. Pt reports emesis for two days.

## 2023-06-20 NOTE — ED Notes (Signed)
Aware of need for urine sample, unable to provide at this time °

## 2023-06-20 NOTE — ED Provider Notes (Signed)
[facility] Provider Note: Lowella Dell, MD, FACEP  CSN: [id number] MRN: [id number] ARRIVAL: 06/20/23 at 2041 ROOM: [facility]07/[facility]07   CHIEF COMPLAINT  Abdominal Pain and Vomiting   HISTORY OF PRESENT ILLNESS  06/20/23 11:23 PM Gloria Gonzales is a 32 y.o. female with 3 days of epigastric pain radiating down to her suprapubic region.  The next day she developed nausea and vomiting.  She has had some mild loose stools but little stool output since this illness began.  She has had chills and bodyaches with this as well.  She rates her pain as a 7 out of 10.  She reports the pain is similar to that she had postpartum pelvic infection after giving birth 6 months ago.   Past Medical History:  Diagnosis Date   Anxiety     No past surgical history on file.  Family History  Problem Relation Age of Onset   Hypertension Mother    Hypertension Father    Diabetes Father    Stroke Father     Social History   Tobacco Use   Smoking status: Never   Smokeless tobacco: Never  Vaping Use   Vaping Use: Never used  Substance Use Topics   Alcohol use: Never   Drug use: Not Currently    Types: Marijuana    Comment: last used 10/21    Prior to Admission medications   Medication Sig Start Date End Date Taking? Authorizing Provider  lansoprazole (PREVACID) 30 MG capsule Take 1 capsule daily at least 30 minutes before first dose of Carafate. 06/21/23  Yes Wallice Granville, Jonny Ruiz, MD  metoCLOPramide (REGLAN) 10 MG tablet Take 1 tablet (10 mg total) by mouth every 6 (six) hours as needed for nausea or vomiting. 06/21/23  Yes Damonique Brunelle, MD  sucralfate (CARAFATE) 1 g tablet Take 1 tablet (1 g total) by mouth 4 (four) times daily -  with meals and at bedtime. 06/21/23  Yes Francene Mcerlean, MD  Multiple Vitamin (MULTIVITAMIN WITH MINERALS) TABS tablet Take 1 tablet by mouth daily.    [provider]  ondansetron (ZOFRAN-ODT) 4 MG disintegrating tablet Take 4 mg by mouth every 8 (eight) hours as  needed for nausea or vomiting.    [provider]  Prenatal Vit-Fe Fumarate-FA (PRENATAL VITAMIN PO) Take 1 tablet by mouth daily.    [provider]  promethazine (PHENERGAN) 25 MG tablet Take 1 tablet (25 mg total) by mouth every 6 (six) hours as needed for nausea or vomiting. 04/21/20   Horton, Mayer Masker, MD  pyridOXINE (VITAMIN B-6) 100 MG tablet Take 100 mg by mouth daily.    [provider]  scopolamine (TRANSDERM-SCOP) 1 MG/3DAYS Place 1 patch (1.5 mg total) onto the skin every 3 (three) days. 10/27/22   Carlynn Herald, CNM    Allergies Shrimp [shellfish allergy] and Clonazepam   REVIEW OF SYSTEMS  Negative except as noted here or in the History of Present Illness.   PHYSICAL EXAMINATION  Initial Vital Signs Blood pressure 116/72, pulse 60, temperature 98.7 F (37.1 C), temperature source Oral, resp. rate 16, height 5\' 6"  (1.676 m), weight 79.8 kg, last menstrual period 06/14/2023, SpO2 97 %, unknown if currently breastfeeding.  Examination General: Well-developed, well-nourished female in no acute distress; appearance consistent with age of record HENT: normocephalic; atraumatic Eyes: Normal appearance Neck: supple Heart: regular rate and rhythm Lungs: clear to auscultation bilaterally Abdomen: soft; nondistended; diffuse tenderness most prominent in the epigastrium; bowel sounds present GU: Normal external genitalia;  no vaginal bleeding; no vaginal discharge; no cervical motion tenderness; no adnexal tenderness Extremities: No deformity; full range of motion; pulses normal Neurologic: Awake, alert and oriented; motor function intact in all extremities and symmetric; no facial droop Skin: Warm and dry Psychiatric: Normal mood and affect   RESULTS  Summary of this visit's results, reviewed and interpreted by myself:   EKG Interpretation  Date/Time:    Ventricular Rate:    PR Interval:    QRS Duration:   QT Interval:    QTC  Calculation:   R Axis:     Text Interpretation:         Laboratory Studies: Results for orders placed or performed during the hospital encounter of 06/20/23 (from the past 24 hour(s))  Resp panel by RT-PCR (RSV, Flu A&B, Covid) Anterior Nasal Swab     Status: None   Collection Time: 06/20/23  8:55 PM   Specimen: Anterior Nasal Swab  Result Value Ref Range   SARS Coronavirus 2 by RT PCR NEGATIVE NEGATIVE   Influenza A by PCR NEGATIVE NEGATIVE   Influenza B by PCR NEGATIVE NEGATIVE   Resp Syncytial Virus by PCR NEGATIVE NEGATIVE  Lipase, blood     Status: None   Collection Time: 06/20/23  9:08 PM  Result Value Ref Range   Lipase 29 11 - 51 U/L  Comprehensive metabolic panel     Status: Abnormal   Collection Time: 06/20/23  9:08 PM  Result Value Ref Range   Sodium 135 135 - 145 mmol/L   Potassium 4.0 3.5 - 5.1 mmol/L   Chloride 99 98 - 111 mmol/L   CO2 26 22 - 32 mmol/L   Glucose, Bld 113 (H) 70 - 99 mg/dL   BUN 13 6 - 20 mg/dL   Creatinine, Ser 1.61 0.44 - 1.00 mg/dL   Calcium 9.2 8.9 - 09.6 mg/dL   Total Protein 8.3 (H) 6.5 - 8.1 g/dL   Albumin 4.4 3.5 - 5.0 g/dL   AST 22 15 - 41 U/L   ALT 35 0 - 44 U/L   Alkaline Phosphatase 51 38 - 126 U/L   Total Bilirubin 1.6 (H) 0.3 - 1.2 mg/dL   GFR, Estimated >04 >54 mL/min   Anion gap 10 5 - 15  CBC     Status: None   Collection Time: 06/20/23  9:08 PM  Result Value Ref Range   WBC 6.8 4.0 - 10.5 K/uL   RBC 4.34 3.87 - 5.11 MIL/uL   Hemoglobin 13.7 12.0 - 15.0 g/dL   HCT 09.8 11.9 - 14.7 %   MCV 94.5 80.0 - 100.0 fL   MCH 31.6 26.0 - 34.0 pg   MCHC 33.4 30.0 - 36.0 g/dL   RDW 82.9 56.2 - 13.0 %   Platelets 299 150 - 400 K/uL   nRBC 0.0 0.0 - 0.2 %  Wet prep, genital     Status: Abnormal   Collection Time: 06/20/23 11:40 PM   Specimen: Cervical / Endocervical swab  Result Value Ref Range   Yeast Wet Prep HPF POC NONE SEEN NONE SEEN   Trich, Wet Prep NONE SEEN NONE SEEN   Clue Cells Wet Prep HPF POC PRESENT (A) NONE  SEEN   WBC, Wet Prep HPF POC <10 <10   Sperm NONE SEEN   Urinalysis, Routine w reflex microscopic -Urine, Clean Catch     Status: Abnormal   Collection Time: 06/21/23  1:09 AM  Result Value Ref Range   Color, Urine YELLOW  YELLOW   APPearance CLEAR CLEAR   Specific Gravity, Urine >=1.030 1.005 - 1.030   pH 6.0 5.0 - 8.0   Glucose, UA NEGATIVE NEGATIVE mg/dL   Hgb urine dipstick NEGATIVE NEGATIVE   Bilirubin Urine SMALL (A) NEGATIVE   Ketones, ur >=80 (A) NEGATIVE mg/dL   Protein, ur 161 (A) NEGATIVE mg/dL   Nitrite NEGATIVE NEGATIVE   Leukocytes,Ua NEGATIVE NEGATIVE  Pregnancy, urine     Status: None   Collection Time: 06/21/23  1:09 AM  Result Value Ref Range   Preg Test, Ur NEGATIVE NEGATIVE  Urinalysis, Microscopic (reflex)     Status: Abnormal   Collection Time: 06/21/23  1:09 AM  Result Value Ref Range   RBC / HPF NONE SEEN 0 - 5 RBC/hpf   WBC, UA 0-5 0 - 5 WBC/hpf   Bacteria, UA RARE (A) NONE SEEN   Squamous Epithelial / HPF 0-5 0 - 5 /HPF   Mucus PRESENT    Imaging Studies: CT ABDOMEN PELVIS W CONTRAST  Result Date: 06/21/2023 CLINICAL DATA:  Abdominal pain and vomiting. EXAM: CT ABDOMEN AND PELVIS WITH CONTRAST TECHNIQUE: Multidetector CT imaging of the abdomen and pelvis was performed using the standard protocol following bolus administration of intravenous contrast. RADIATION DOSE REDUCTION: This exam was performed according to the departmental dose-optimization program which includes automated exposure control, adjustment of the mA and/or kV according to patient size and/or use of iterative reconstruction technique. CONTRAST:  OMNIPAQUE IOHEXOL 300 MG/ML  SOLN COMPARISON:  May 11, 2019 FINDINGS: Lower chest: No acute abnormality. Hepatobiliary: 1.9 cm hepatic cyst versus hemangioma is seen within the posteromedial aspect of the right lobe of the liver. This measured 1.0 cm on the prior study. The gallbladder is normal in appearance, without evidence of gallstones,  gallbladder wall thickening or pericholecystic inflammation. No biliary dilatation is noted. Pancreas: Unremarkable. No pancreatic ductal dilatation or surrounding inflammatory changes. Spleen: Normal in size without focal abnormality. Adrenals/Urinary Tract: Adrenal glands are unremarkable. Kidneys are normal, without renal calculi, focal lesion, or hydronephrosis. The urinary bladder is poorly distended and subsequently limited in evaluation. Stomach/Bowel: Stomach is within normal limits. Appendix appears normal. No evidence of bowel wall thickening, distention, or inflammatory changes. Vascular/Lymphatic: No significant vascular findings are present. No enlarged abdominal or pelvic lymph nodes. Reproductive: The uterus and right adnexa are unremarkable. A 17 mm diameter simple cyst is seen within the left adnexa. Other: No abdominal wall hernia or abnormality. No abdominopelvic ascites. Musculoskeletal: No acute or significant osseous findings. IMPRESSION: 1. 1.9 cm hepatic cyst versus hemangioma. Correlation with nonemergent MRI is recommended to further exclude the presence of an underlying neoplastic process. 2. 17 mm diameter simple left ovarian cyst. No follow-up imaging is recommended. This recommendation follows ACR consensus guidelines: White Paper of the ACR Incidental Findings Committee II on Adnexal Findings. J Am Coll Radiol [phone]. Electronically Signed   By: Aram Candela M.D.   On: 06/21/2023 02:12    ED COURSE and MDM  Nursing notes, initial and subsequent vitals signs, including pulse oximetry, reviewed and interpreted by myself.  Vitals:   06/21/23 0245 06/21/23 0300 06/21/23 0315 06/21/23 0330  BP: 103/70 128/73 104/61 (!) 110/57  Pulse: 82 65 76 69  Resp: 17 (!) 22 17 17   Temp:      TempSrc:      SpO2: 100% 99% 99% 98%  Weight:      Height:       Medications  ondansetron (ZOFRAN) injection 4 mg (4  mg Intravenous Given 06/20/23 2333)  sodium chloride 0.9 % bolus  1,000 mL (0 mLs Intravenous Stopped 06/21/23 0106)  pantoprazole (PROTONIX) injection 40 mg (40 mg Intravenous Given 06/20/23 2347)  iohexol (OMNIPAQUE) 300 MG/ML solution 100 mL (100 mLs Intravenous Contrast Given 06/21/23 0155)  sucralfate (CARAFATE) 1 GM/10ML suspension 1 g (1 g Oral Given 06/21/23 0334)  metoCLOPramide (REGLAN) injection 10 mg (10 mg Intravenous Given 06/21/23 0229)   3:37 AM Patient had a return of nausea despite Zofran.  She was given Reglan and the nausea has now subsided.  She was able to hold down Carafate.  She was advised of her CT findings which were reassuring except for the hepatic cysts.  She was advised to follow-up with her primary care physician regarding follow-up for that.  Her laboratory studies are reassuring as well.  I suspect her pain and vomiting are due to gastritis.  A viral gastroenteritis is also a possibility but this would be an unusual cause of pain primarily in the epigastrium.  Gastroenteritis tends to cause more diffuse pain.  We will place her on a PPI and Carafate.   PROCEDURES  Procedures   ED DIAGNOSES     ICD-10-CM   1. Epigastric pain  R10.13     2. Nausea and vomiting in adult  R11.2     3. Abnormal CT of the abdomen  R93.5          Anelise Staron, Jonny Ruiz, MD 06/21/23 [phone]

## 2023-06-21 ENCOUNTER — Encounter (HOSPITAL_BASED_OUTPATIENT_CLINIC_OR_DEPARTMENT_OTHER): Payer: Self-pay

## 2023-06-21 ENCOUNTER — Emergency Department (HOSPITAL_BASED_OUTPATIENT_CLINIC_OR_DEPARTMENT_OTHER): Payer: Medicaid Other

## 2023-06-21 LAB — URINALYSIS, MICROSCOPIC (REFLEX): RBC / HPF: NONE SEEN RBC/hpf (ref 0–5)

## 2023-06-21 LAB — URINALYSIS, ROUTINE W REFLEX MICROSCOPIC
Glucose, UA: NEGATIVE mg/dL
Hgb urine dipstick: NEGATIVE
Ketones, ur: >=80 mg/dL — AB
Leukocytes,Ua: NEGATIVE
Nitrite: NEGATIVE
Protein, ur: 100 mg/dL — AB
Specific Gravity, Urine: >=1.03 (ref 1.005–1.030)
pH: 6 (ref 5.0–8.0)

## 2023-06-21 LAB — PREGNANCY, URINE: Preg Test, Ur: NEGATIVE

## 2023-06-21 MED ORDER — IOHEXOL 300 MG/ML  SOLN
100.0000 mL | Freq: Once | INTRAMUSCULAR | Status: AC | PRN
  Administered 2023-06-21: 100 mL via INTRAVENOUS

## 2023-06-21 MED ORDER — METOCLOPRAMIDE HCL 5 MG/ML IJ SOLN
10.0000 mg | Freq: Once | INTRAMUSCULAR | Status: AC
  Administered 2023-06-21: 10 mg via INTRAVENOUS
  Filled 2023-06-21: qty 2

## 2023-06-21 MED ORDER — METOCLOPRAMIDE HCL 10 MG PO TABS: 10.0000 mg | ORAL_TABLET | Freq: Four times a day (QID) | ORAL | 0 refills | Status: AC | PRN

## 2023-06-21 MED ORDER — SUCRALFATE 1 G PO TABS: 1.0000 g | ORAL_TABLET | Freq: Three times a day (TID) | ORAL | 0 refills | Status: AC

## 2023-06-21 MED ORDER — LANSOPRAZOLE 30 MG PO CPDR: DELAYED_RELEASE_CAPSULE | ORAL | 0 refills | Status: DC

## 2023-06-21 MED ORDER — SUCRALFATE 1 GM/10ML PO SUSP
1.0000 g | Freq: Once | ORAL | Status: AC
  Administered 2023-06-21: 1 g via ORAL
  Filled 2023-06-21: qty 10

## 2023-06-21 NOTE — Discharge Instructions (Signed)
Your CT scan was normal except for a lesion on your liver.  This is most likely a benign cyst or benign hemangioma (tumor made of blood vessels).  Because of the small chance of this being a cancerous lesion you should follow-up with your PCP at [facility] to discuss further testing such as an MRI.

## 2023-06-22 LAB — GC/CHLAMYDIA PROBE AMP (~~LOC~~) NOT AT ARMC
Chlamydia: NEGATIVE
Comment: NEGATIVE
Comment: NORMAL
Neisseria Gonorrhea: NEGATIVE

## 2023-08-31 ENCOUNTER — Emergency Department (HOSPITAL_BASED_OUTPATIENT_CLINIC_OR_DEPARTMENT_OTHER)
Admission: EM | Admit: 2023-08-31 | Discharge: 2023-09-01 | Disposition: A | Discharge status: Home / Self Care | Payer: Medicaid Other | Attending: Emergency Medicine | Admitting: Emergency Medicine

## 2023-08-31 ENCOUNTER — Encounter (HOSPITAL_BASED_OUTPATIENT_CLINIC_OR_DEPARTMENT_OTHER): Payer: Self-pay | Admitting: Emergency Medicine

## 2023-08-31 ENCOUNTER — Other Ambulatory Visit: Payer: Self-pay

## 2023-08-31 DIAGNOSIS — R112 Nausea with vomiting, unspecified: Secondary | ICD-10-CM

## 2023-08-31 DIAGNOSIS — U071 COVID-19: Secondary | ICD-10-CM | POA: Insufficient documentation

## 2023-08-31 DIAGNOSIS — R1033 Periumbilical pain: Secondary | ICD-10-CM | POA: Diagnosis not present

## 2023-08-31 LAB — COMPREHENSIVE METABOLIC PANEL
ALT: 23 U/L (ref 0–44)
AST: 23 U/L (ref 15–41)
Albumin: 4.7 g/dL (ref 3.5–5.0)
Alkaline Phosphatase: 55 U/L (ref 38–126)
Anion gap: 14 (ref 5–15)
BUN: 17 mg/dL (ref 6–20)
CO2: 24 mmol/L (ref 22–32)
Calcium: 9.6 mg/dL (ref 8.9–10.3)
Chloride: 102 mmol/L (ref 98–111)
Creatinine, Ser: 1.16 mg/dL — ABNORMAL HIGH (ref 0.44–1.00)
GFR, Estimated: >60 mL/min (ref >60)
Glucose, Bld: 109 mg/dL — ABNORMAL HIGH (ref 70–99)
Potassium: 4.1 mmol/L (ref 3.5–5.1)
Sodium: 140 mmol/L (ref 135–145)
Total Bilirubin: 1.6 mg/dL — ABNORMAL HIGH (ref 0.3–1.2)
Total Protein: 8.9 g/dL — ABNORMAL HIGH (ref 6.5–8.1)

## 2023-08-31 LAB — CBC
HCT: 43.3 % (ref 36.0–46.0)
Hemoglobin: 15.1 g/dL — ABNORMAL HIGH (ref 12.0–15.0)
MCH: 32.2 pg (ref 26.0–34.0)
MCHC: 34.9 g/dL (ref 30.0–36.0)
MCV: 92.3 fL (ref 80.0–100.0)
Platelets: 278 10*3/uL (ref 150–400)
RBC: 4.69 MIL/uL (ref 3.87–5.11)
RDW: 12.2 % (ref 11.5–15.5)
WBC: 6.5 10*3/uL (ref 4.0–10.5)
nRBC: 0 % (ref 0.0–0.2)

## 2023-08-31 LAB — SARS CORONAVIRUS 2 BY RT PCR: SARS Coronavirus 2 by RT PCR: POSITIVE — AB

## 2023-08-31 LAB — LIPASE, BLOOD: Lipase: 26 U/L (ref 11–51)

## 2023-08-31 MED ORDER — ONDANSETRON HCL 4 MG/2ML IJ SOLN
4.0000 mg | Freq: Once | INTRAMUSCULAR | Status: AC
  Administered 2023-08-31: 4 mg via INTRAVENOUS
  Filled 2023-08-31: qty 2

## 2023-08-31 NOTE — ED Triage Notes (Signed)
Patient arrived via POV c/o abdominal pain with emesis x 1 day. Patient states 10 episodes of emesis during last 24 hrs. Patient states 8/10 pain. Patient is AO x 4, VS WDL, slow gait.

## 2023-09-01 ENCOUNTER — Emergency Department (HOSPITAL_BASED_OUTPATIENT_CLINIC_OR_DEPARTMENT_OTHER): Payer: Medicaid Other

## 2023-09-01 LAB — URINALYSIS, ROUTINE W REFLEX MICROSCOPIC
Glucose, UA: NEGATIVE mg/dL
Hgb urine dipstick: NEGATIVE
Ketones, ur: 80 mg/dL — AB
Leukocytes,Ua: NEGATIVE
Nitrite: NEGATIVE
Protein, ur: 100 mg/dL — AB
Specific Gravity, Urine: >=1.03 (ref 1.005–1.030)
pH: 5.5 (ref 5.0–8.0)

## 2023-09-01 LAB — PREGNANCY, URINE: Preg Test, Ur: NEGATIVE

## 2023-09-01 LAB — URINALYSIS, MICROSCOPIC (REFLEX): Bacteria, UA: NONE SEEN

## 2023-09-01 MED ORDER — IOHEXOL 350 MG/ML SOLN: 100.0000 mL | Freq: Once | INTRAVENOUS | Status: DC | PRN

## 2023-09-01 MED ORDER — SODIUM CHLORIDE 0.9 % IV BOLUS
1000.0000 mL | Freq: Once | INTRAVENOUS | Status: AC
  Administered 2023-09-01: 1000 mL via INTRAVENOUS

## 2023-09-01 MED ORDER — ONDANSETRON 4 MG PO TBDP: 4.0000 mg | ORAL_TABLET | Freq: Three times a day (TID) | ORAL | 0 refills | Status: DC | PRN

## 2023-09-01 MED ORDER — ONDANSETRON HCL 4 MG/2ML IJ SOLN
4.0000 mg | Freq: Once | INTRAMUSCULAR | Status: AC
  Administered 2023-09-01: 4 mg via INTRAVENOUS
  Filled 2023-09-01: qty 2

## 2023-09-01 MED ORDER — IOHEXOL 300 MG/ML  SOLN
100.0000 mL | Freq: Once | INTRAMUSCULAR | Status: AC | PRN
  Administered 2023-09-01: 100 mL via INTRAVENOUS

## 2023-09-01 MED ORDER — FENTANYL CITRATE PF 50 MCG/ML IJ SOSY
50.0000 ug | PREFILLED_SYRINGE | Freq: Once | INTRAMUSCULAR | Status: AC
  Administered 2023-09-01: 50 ug via INTRAVENOUS
  Filled 2023-09-01: qty 1

## 2023-09-01 MED ORDER — LANSOPRAZOLE 30 MG PO CPDR: 30.0000 mg | DELAYED_RELEASE_CAPSULE | Freq: Every day | ORAL | 0 refills | Status: DC

## 2023-09-01 NOTE — ED Notes (Signed)
Pt does not have the urge to use the restroom

## 2023-09-01 NOTE — ED Provider Notes (Signed)
[place] [facility] AT [facility] Provider Note   CSN: [id number] Arrival date & time: 08/31/23  1945     History  Chief Complaint  Patient presents with   Abdominal Pain    Gloria Gonzales is a 32 y.o. female.  Patient with nausea and vomiting since last evening.  States she is vomited approximately 10 times in the past 24 hours was not able to eat or drink anything today.  Did have 2 loose stools as well.  Emesis is nonbloody and nonbilious.  No fever.  No travel or sick contacts.  Endorses crampy diffuse abdominal pain worse periumbilically.  Unable to eat or drink anything at all today.  No pain with urination or blood in the urine.  No chest pain or shortness of breath.  Denies cough, runny nose, sore throat, difficulty breathing or chest pain.  Reports increase in allergies which is typical for the stimulator.  Still has appendix and gallbladder.  Denies possibility of pregnancy.  Was seen for this previously in June and was supposed to follow-up with gastroenterology but did not.  The history is provided by the patient.  Abdominal Pain Associated symptoms: fatigue, nausea and vomiting   Associated symptoms: no cough, no diarrhea, no dysuria, no fever, no hematuria and no shortness of breath        Home Medications Prior to Admission medications   Medication Sig Start Date End Date Taking? Authorizing Provider  lansoprazole (PREVACID) 30 MG capsule Take 1 capsule daily at least 30 minutes before first dose of Carafate. 06/21/23   Molpus, Jonny Ruiz, MD  metoCLOPramide (REGLAN) 10 MG tablet Take 1 tablet (10 mg total) by mouth every 6 (six) hours as needed for nausea or vomiting. 06/21/23   Molpus, John, MD  Multiple Vitamin (MULTIVITAMIN WITH MINERALS) TABS tablet Take 1 tablet by mouth daily.    [provider]  ondansetron (ZOFRAN-ODT) 4 MG disintegrating tablet Take 4 mg by mouth every 8 (eight) hours as needed for nausea or vomiting.    [provider]  Prenatal Vit-Fe Fumarate-FA (PRENATAL VITAMIN PO) Take 1 tablet by mouth daily.    [provider]  promethazine (PHENERGAN) 25 MG tablet Take 1 tablet (25 mg total) by mouth every 6 (six) hours as needed for nausea or vomiting. 04/21/20   Horton, Mayer Masker, MD  pyridOXINE (VITAMIN B-6) 100 MG tablet Take 100 mg by mouth daily.    [provider]  scopolamine (TRANSDERM-SCOP) 1 MG/3DAYS Place 1 patch (1.5 mg total) onto the skin every 3 (three) days. 10/27/22   Carlynn Herald, CNM  sucralfate (CARAFATE) 1 g tablet Take 1 tablet (1 g total) by mouth 4 (four) times daily -  with meals and at bedtime. 06/21/23   Molpus, John, MD      Allergies    Shrimp [shellfish allergy] and Clonazepam    Review of Systems   Review of Systems  Constitutional:  Positive for activity change, appetite change and fatigue. Negative for fever.  HENT:  Negative for congestion and rhinorrhea.   Respiratory:  Negative for cough, chest tightness and shortness of breath.   Gastrointestinal:  Positive for abdominal pain, nausea and vomiting. Negative for diarrhea.  Genitourinary:  Negative for dysuria and hematuria.  Musculoskeletal:  Negative for arthralgias and myalgias.  Skin:  Negative for wound.  Neurological:  Negative for dizziness, weakness and headaches.   all other systems are negative except as noted in the HPI and PMH.  Physical Exam Updated Vital Signs BP (!) 121/92 (BP Location: Left Arm)   Pulse 76   Temp 98.3 F (36.8 C) (Oral)   Resp 18   Ht 5\' 6"  (1.676 m)   Wt 79.4 kg   LMP 08/06/2023 (Exact Date)   SpO2 100%   BMI 28.25 kg/m  Physical Exam Vitals and nursing note reviewed.  Constitutional:      General: She is not in acute distress.    Appearance: She is well-developed.  HENT:     Head: Normocephalic and atraumatic.     Mouth/Throat:     Pharynx: No oropharyngeal exudate.  Eyes:     Conjunctiva/sclera: Conjunctivae normal.     Pupils:  Pupils are equal, round, and reactive to light.  Neck:     Comments: No meningismus. Cardiovascular:     Rate and Rhythm: Normal rate and regular rhythm.     Heart sounds: Normal heart sounds. No murmur heard. Pulmonary:     Effort: Pulmonary effort is normal. No respiratory distress.     Breath sounds: Normal breath sounds.  Abdominal:     Palpations: Abdomen is soft.     Tenderness: There is abdominal tenderness. There is no guarding or rebound.     Comments: periumbilical tenderness, no guarding or rebound  Musculoskeletal:        General: No tenderness. Normal range of motion.     Cervical back: Normal range of motion and neck supple.  Skin:    General: Skin is warm.  Neurological:     Mental Status: She is alert and oriented to person, place, and time.     Cranial Nerves: No cranial nerve deficit.     Motor: No abnormal muscle tone.     Coordination: Coordination normal.     Comments:  5/5 strength throughout. CN 2-12 intact.Equal grip strength.   Psychiatric:        Behavior: Behavior normal.     ED Results / Procedures / Treatments   Labs (all labs ordered are listed, but only abnormal results are displayed) Labs Reviewed  SARS CORONAVIRUS 2 BY RT PCR - Abnormal; Notable for the following components:      Result Value   SARS Coronavirus 2 by RT PCR POSITIVE (*)    All other components within normal limits  COMPREHENSIVE METABOLIC PANEL - Abnormal; Notable for the following components:   Glucose, Bld 109 (*)    Creatinine, Ser 1.16 (*)    Total Protein 8.9 (*)    Total Bilirubin 1.6 (*)    All other components within normal limits  CBC - Abnormal; Notable for the following components:   Hemoglobin 15.1 (*)    All other components within normal limits  URINALYSIS, ROUTINE W REFLEX MICROSCOPIC - Abnormal; Notable for the following components:   APPearance HAZY (*)    Bilirubin Urine MODERATE (*)    Ketones, ur 80 (*)    Protein, ur 100 (*)    All other  components within normal limits  LIPASE, BLOOD  PREGNANCY, URINE  URINALYSIS, MICROSCOPIC (REFLEX)    EKG None  Radiology CT ABDOMEN PELVIS W CONTRAST  Result Date: 09/01/2023 CLINICAL DATA:  Abdominal pain, vomiting EXAM: CT ABDOMEN AND PELVIS WITH CONTRAST TECHNIQUE: Multidetector CT imaging of the abdomen and pelvis was performed using the standard protocol following bolus administration of intravenous contrast. RADIATION DOSE REDUCTION: This exam was performed according to the departmental dose-optimization program which includes automated exposure control, adjustment of the mA and/or kV  according to patient size and/or use of iterative reconstruction technique. CONTRAST:  OMNIPAQUE IOHEXOL 300 MG/ML  SOLN COMPARISON:  06/21/2023 FINDINGS: Lower chest: No acute abnormality Hepatobiliary: 1.5 cm low-density lesion in the posterior right hepatic lobe with peripheral contrast puddling most compatible with hemangioma. No suspicious focal hepatic abnormality. Gallbladder unremarkable. Pancreas: No focal abnormality or ductal dilatation. Spleen: No focal abnormality.  Normal size. Adrenals/Urinary Tract: No adrenal abnormality. No focal renal abnormality. No stones or hydronephrosis. Urinary bladder is unremarkable. Stomach/Bowel: Normal appendix. Stomach, large and small bowel grossly unremarkable. Vascular/Lymphatic: No evidence of aneurysm or adenopathy. Reproductive: Uterus and adnexa unremarkable.  No mass. Other: No free fluid or free air. Musculoskeletal: No acute bony abnormality. IMPRESSION: No acute findings in the abdomen or pelvis. Electronically Signed   By: Charlett Nose M.D.   On: 09/01/2023 03:24    Procedures Procedures    Medications Ordered in ED Medications  ondansetron (ZOFRAN) injection 4 mg (4 mg Intravenous Given 08/31/23 2254)  sodium chloride 0.9 % bolus 1,000 mL (1,000 mLs Intravenous New Bag/Given 09/01/23 0013)  ondansetron (ZOFRAN) injection 4 mg (4 mg Intravenous  Given 09/01/23 0012)  fentaNYL (SUBLIMAZE) injection 50 mcg (50 mcg Intravenous Given 09/01/23 0012)    ED Course/ Medical Decision Making/ A&P                                 Medical Decision Making Amount and/or Complexity of Data Reviewed Labs: ordered. Decision-making details documented in ED Course. Radiology: ordered and independent interpretation performed. Decision-making details documented in ED Course. ECG/medicine tests: ordered and independent interpretation performed. Decision-making details documented in ED Course.  Risk Prescription drug management.    Diffuse abdominal pain with vomiting for the past 24 hours.  Abdomen soft without peritoneal signs. Was seen in the ED in June and placed on PPI and Carafate.  Did not follow-up with GI.  Patient given IV fluids and symptom control.  Found to be COVID-positive in triage.  Denies respiratory symptoms.  This may explain her GI symptoms.   UA negative for infection but does show large ketones.  Patient given additional IV fluids.  No vomiting throughout ED course.  Labs are reassuring.  LFTs and lipase are normal.  No leukocytosis.  CT scan shows no acute intra-abdominal pathology.  Normal gallbladder and appendix.  Results reviewed and  interpreted by me.  On recheck, patient is feeling improved.  Suspect likely viral illness, possibly COVID causing her nausea and vomiting.  She has no respiratory symptoms.  No hypoxia or increased work of breathing. She is able to tolerate p.o. without further vomiting.  Abdomen soft without peritoneal signs.  Discussed clear liquid diet, antiemetics, PCP follow-up. Continue PPI.  Follow-up with PCP as well as GI. Return to the ED with worsening pain, not able to eat or drink, fever, difficulty breathing or other concerns.        Final Clinical Impression(s) / ED Diagnoses Final diagnoses:  Nausea and vomiting, unspecified vomiting type  COVID-19    Rx / DC Orders ED Discharge  Orders     None         Stelios Kirby, Jeannett Senior, MD 09/01/23 [phone]

## 2023-09-01 NOTE — Discharge Instructions (Signed)
Your COVID test is positive which may be causing your vomiting or another kind of viral illness.  Start with a clear liquid diet and advance slowly as tolerated.  Take the stomach medication as prescribed.  Return to the ED for worsening pain, fever, vomiting, not able to eat or drink or other concerns.

## 2023-12-14 ENCOUNTER — Emergency Department (HOSPITAL_BASED_OUTPATIENT_CLINIC_OR_DEPARTMENT_OTHER): Payer: Medicaid Other

## 2023-12-14 ENCOUNTER — Emergency Department (HOSPITAL_BASED_OUTPATIENT_CLINIC_OR_DEPARTMENT_OTHER)
Admission: EM | Admit: 2023-12-14 | Discharge: 2023-12-14 | Disposition: A | Discharge status: Home / Self Care | Payer: Medicaid Other | Attending: Emergency Medicine | Admitting: Emergency Medicine

## 2023-12-14 ENCOUNTER — Encounter (HOSPITAL_BASED_OUTPATIENT_CLINIC_OR_DEPARTMENT_OTHER): Payer: Self-pay | Admitting: Urology

## 2023-12-14 DIAGNOSIS — R197 Diarrhea, unspecified: Secondary | ICD-10-CM | POA: Insufficient documentation

## 2023-12-14 DIAGNOSIS — R112 Nausea with vomiting, unspecified: Secondary | ICD-10-CM | POA: Insufficient documentation

## 2023-12-14 DIAGNOSIS — R102 Pelvic and perineal pain: Secondary | ICD-10-CM | POA: Diagnosis not present

## 2023-12-14 DIAGNOSIS — Z79899 Other long term (current) drug therapy: Secondary | ICD-10-CM | POA: Insufficient documentation

## 2023-12-14 DIAGNOSIS — R1013 Epigastric pain: Secondary | ICD-10-CM | POA: Insufficient documentation

## 2023-12-14 DIAGNOSIS — I491 Atrial premature depolarization: Secondary | ICD-10-CM | POA: Diagnosis not present

## 2023-12-14 LAB — URINALYSIS, MICROSCOPIC (REFLEX)

## 2023-12-14 LAB — CBC WITH DIFFERENTIAL/PLATELET
Abs Immature Granulocytes: 0.02 10*3/uL (ref 0.00–0.07)
Basophils Absolute: 0 10*3/uL (ref 0.0–0.1)
Basophils Relative: 0 %
Eosinophils Absolute: 0 10*3/uL (ref 0.0–0.5)
Eosinophils Relative: 0 %
HCT: 41.7 % (ref 36.0–46.0)
Hemoglobin: 14.5 g/dL (ref 12.0–15.0)
Immature Granulocytes: 0 %
Lymphocytes Relative: 19 %
Lymphs Abs: 1.2 10*3/uL (ref 0.7–4.0)
MCH: 31.9 pg (ref 26.0–34.0)
MCHC: 34.8 g/dL (ref 30.0–36.0)
MCV: 91.9 fL (ref 80.0–100.0)
Monocytes Absolute: 0.2 10*3/uL (ref 0.1–1.0)
Monocytes Relative: 3 %
Neutro Abs: 5 10*3/uL (ref 1.7–7.7)
Neutrophils Relative %: 78 %
Platelets: 228 10*3/uL (ref 150–400)
RBC: 4.54 MIL/uL (ref 3.87–5.11)
RDW: 11.9 % (ref 11.5–15.5)
WBC: 6.5 10*3/uL (ref 4.0–10.5)
nRBC: 0 % (ref 0.0–0.2)

## 2023-12-14 LAB — URINALYSIS, ROUTINE W REFLEX MICROSCOPIC
Bilirubin Urine: NEGATIVE
Glucose, UA: NEGATIVE mg/dL
Ketones, ur: >=80 mg/dL — AB
Leukocytes,Ua: NEGATIVE
Nitrite: NEGATIVE
Protein, ur: 30 mg/dL — AB
Specific Gravity, Urine: 1.015 (ref 1.005–1.030)
pH: 5.5 (ref 5.0–8.0)

## 2023-12-14 LAB — COMPREHENSIVE METABOLIC PANEL
ALT: 22 U/L (ref 0–44)
AST: 23 U/L (ref 15–41)
Albumin: 4.5 g/dL (ref 3.5–5.0)
Alkaline Phosphatase: 45 U/L (ref 38–126)
Anion gap: 11 (ref 5–15)
BUN: 11 mg/dL (ref 6–20)
CO2: 20 mmol/L — ABNORMAL LOW (ref 22–32)
Calcium: 9.3 mg/dL (ref 8.9–10.3)
Chloride: 103 mmol/L (ref 98–111)
Creatinine, Ser: 0.8 mg/dL (ref 0.44–1.00)
GFR, Estimated: >60 mL/min (ref >60)
Glucose, Bld: 127 mg/dL — ABNORMAL HIGH (ref 70–99)
Potassium: 3.7 mmol/L (ref 3.5–5.1)
Sodium: 134 mmol/L — ABNORMAL LOW (ref 135–145)
Total Bilirubin: 1.6 mg/dL — ABNORMAL HIGH (ref <1.2)
Total Protein: 8 g/dL (ref 6.5–8.1)

## 2023-12-14 LAB — LIPASE, BLOOD: Lipase: 25 U/L (ref 11–51)

## 2023-12-14 LAB — HCG, SERUM, QUALITATIVE: Preg, Serum: NEGATIVE

## 2023-12-14 LAB — RAPID URINE DRUG SCREEN, HOSP PERFORMED
Amphetamines: NOT DETECTED
Barbiturates: NOT DETECTED
Benzodiazepines: NOT DETECTED
Cocaine: NOT DETECTED
Opiates: NOT DETECTED
Tetrahydrocannabinol: POSITIVE — AB

## 2023-12-14 MED ORDER — ONDANSETRON 4 MG PO TBDP
4.0000 mg | ORAL_TABLET | Freq: Once | ORAL | Status: AC
  Administered 2023-12-14: 4 mg via ORAL
  Filled 2023-12-14: qty 1

## 2023-12-14 MED ORDER — PANTOPRAZOLE SODIUM 20 MG PO TBEC
20.0000 mg | DELAYED_RELEASE_TABLET | Freq: Every day | ORAL | 0 refills | Status: DC
Start: 2023-12-14 — End: 2024-11-23

## 2023-12-14 MED ORDER — DIPHENHYDRAMINE HCL 50 MG/ML IJ SOLN
25.0000 mg | Freq: Once | INTRAMUSCULAR | Status: AC
Start: 2023-12-14 — End: 2023-12-14
  Administered 2023-12-14: 25 mg via INTRAVENOUS
  Filled 2023-12-14: qty 1

## 2023-12-14 MED ORDER — DROPERIDOL 2.5 MG/ML IJ SOLN
1.2500 mg | Freq: Once | INTRAMUSCULAR | Status: AC
  Administered 2023-12-14: 1.25 mg via INTRAVENOUS
  Filled 2023-12-14: qty 2

## 2023-12-14 MED ORDER — ONDANSETRON HCL 4 MG PO TABS: 4.0000 mg | ORAL_TABLET | Freq: Four times a day (QID) | ORAL | 0 refills | Status: AC

## 2023-12-14 MED ORDER — SODIUM CHLORIDE 0.9 % IV BOLUS
1000.0000 mL | Freq: Once | INTRAVENOUS | Status: AC
  Administered 2023-12-14: 1000 mL via INTRAVENOUS

## 2023-12-14 MED ORDER — IOHEXOL 300 MG/ML  SOLN
100.0000 mL | Freq: Once | INTRAMUSCULAR | Status: AC | PRN
  Administered 2023-12-14: 100 mL via INTRAVENOUS

## 2023-12-14 NOTE — ED Notes (Signed)
Pt noted to not be in the room when this RN walked in to d/c. Unable to go over d/c paperwork. Unable to re-assess vital signs.

## 2023-12-14 NOTE — ED Provider Notes (Signed)
[place] [facility] Provider Note   CSN: [id number] Arrival date & time: 12/14/23  1326     History Chief Complaint  Patient presents with   Pelvic Pain    Gloria Gonzales is a 32 y.o. female self reportedly otherwise healthy presents to the ER for evaluation of epigastric pain with nausea and vomiting. The patient reports that this pain started this morning. She has been having N/V/D since this morning reports 5 episodes of vomiting/dry heaving. Reports a few episodes of diarrhea that was not black or bloody. The patient reports that this feels similar to her previous episodes.  She uses to follow-up with GI however did not given that she had not had any repeat of symptoms for a while.  She denies any chest pain or shortness of breath.  Denies any vaginal symptoms or any urinary symptoms.  Denies any radiation of her pain.  Denies any recent fevers.  No recent sick contacts however does have a small child at home.  Allergic to clonazepam and shrimp.  She reports former MJ use, but has stopped for a year. Denies any recent EtOH or tobacco use.    **I see that nursing note mentions pelvic pain, however patient reports that is epigastric or upper GI pain. She is not experiencing any lower GI or pelvic pain.   Pelvic Pain Associated symptoms include abdominal pain. Pertinent negatives include no chest pain and no shortness of breath.       Home Medications Prior to Admission medications   Medication Sig Start Date End Date Taking? Authorizing Provider  lansoprazole (PREVACID) 30 MG capsule Take 1 capsule (30 mg total) by mouth daily. Take 1 capsule daily at least 30 minutes before first dose of Carafate. 09/01/23   Rancour, Jeannett Senior, MD  metoCLOPramide (REGLAN) 10 MG tablet Take 1 tablet (10 mg total) by mouth every 6 (six) hours as needed for nausea or vomiting. 06/21/23   Molpus, John, MD  Multiple Vitamin (MULTIVITAMIN WITH MINERALS) TABS tablet Take 1  tablet by mouth daily.    [provider]  ondansetron (ZOFRAN-ODT) 4 MG disintegrating tablet Take 1 tablet (4 mg total) by mouth every 8 (eight) hours as needed. 09/01/23   Rancour, Jeannett Senior, MD  Prenatal Vit-Fe Fumarate-FA (PRENATAL VITAMIN PO) Take 1 tablet by mouth daily.    [provider]  promethazine (PHENERGAN) 25 MG tablet Take 1 tablet (25 mg total) by mouth every 6 (six) hours as needed for nausea or vomiting. 04/21/20   Horton, Mayer Masker, MD  pyridOXINE (VITAMIN B-6) 100 MG tablet Take 100 mg by mouth daily.    [provider]  scopolamine (TRANSDERM-SCOP) 1 MG/3DAYS Place 1 patch (1.5 mg total) onto the skin every 3 (three) days. 10/27/22   Carlynn Herald, CNM  sucralfate (CARAFATE) 1 g tablet Take 1 tablet (1 g total) by mouth 4 (four) times daily -  with meals and at bedtime. 06/21/23   Molpus, John, MD      Allergies    Shrimp [shellfish allergy] and Clonazepam    Review of Systems   Review of Systems  Constitutional:  Negative for chills and fever.  Respiratory:  Negative for shortness of breath.   Cardiovascular:  Negative for chest pain.  Gastrointestinal:  Positive for abdominal pain, nausea and vomiting. Negative for blood in stool.  Genitourinary:  Negative for dysuria, hematuria, pelvic pain, vaginal bleeding, vaginal discharge and vaginal pain.    Physical Exam Updated Vital Signs  BP (!) 156/91   Pulse (!) 48   Temp 97.9 F (36.6 C) (Oral)   Resp 14   Ht 5\' 6"  (1.676 m)   Wt 79.6 kg   LMP 11/16/2023   SpO2 100%   BMI 28.32 kg/m  Physical Exam Vitals and nursing note reviewed.  Constitutional:      Appearance: She is not toxic-appearing.     Comments: uncomfortable, but nontoxic-appearing  HENT:     Mouth/Throat:     Comments: dry Cardiovascular:     Rate and Rhythm: Bradycardia present.  Pulmonary:     Effort: Pulmonary effort is normal. No respiratory distress.     Breath sounds: Normal breath sounds.  Abdominal:      General: Bowel sounds are normal. There is no distension.     Palpations: Abdomen is soft.     Tenderness: There is generalized abdominal tenderness. There is no guarding or rebound.     Comments: Generalized abdominal tenderness however does have more tenderness to the upper portion of the abdomen.  Soft.  Nondistended.  No guarding or rebound.  Skin:    General: Skin is warm and dry.  Neurological:     General: No focal deficit present.     Mental Status: She is alert.     ED Results / Procedures / Treatments   Labs (all labs ordered are listed, but only abnormal results are displayed) Labs Reviewed  COMPREHENSIVE METABOLIC PANEL - Abnormal; Notable for the following components:      Result Value   Sodium 134 (*)    CO2 20 (*)    Glucose, Bld 127 (*)    Total Bilirubin 1.6 (*)    All other components within normal limits  CBC WITH DIFFERENTIAL/PLATELET  LIPASE, BLOOD  HCG, SERUM, QUALITATIVE  URINALYSIS, ROUTINE W REFLEX MICROSCOPIC  RAPID URINE DRUG SCREEN, HOSP PERFORMED    EKG EKG Interpretation Date/Time:  Tuesday December 14 2023 13:42:51 EST Ventricular Rate:  60 PR Interval:  136 QRS Duration:  70 QT Interval:  410 QTC Calculation: 410 R Axis:   74  Text Interpretation: Normal sinus rhythm with sinus arrhythmia Normal ECG When compared with ECG of 13-Apr-2021 11:45, PREVIOUS ECG IS PRESENT Confirmed by Tanda Rockers ([id number]) on 12/14/2023 2:07:24 PM  Radiology CT ABDOMEN PELVIS W CONTRAST Result Date: 12/14/2023 CLINICAL DATA:  Upper abdominal pain with nausea, vomiting, and diarrhea. EXAM: CT ABDOMEN AND PELVIS WITH CONTRAST TECHNIQUE: Multidetector CT imaging of the abdomen and pelvis was performed using the standard protocol following bolus administration of intravenous contrast. RADIATION DOSE REDUCTION: This exam was performed according to the departmental dose-optimization program which includes automated exposure control, adjustment of the mA and/or kV  according to patient size and/or use of iterative reconstruction technique. CONTRAST:  OMNIPAQUE IOHEXOL 300 MG/ML  SOLN COMPARISON:  CT abdomen pelvis dated September 01, 2023. FINDINGS: Lower chest: No acute abnormality. Hepatobiliary: Unchanged 1.4 cm hemangioma in the medial posterior right liver. No new focal liver abnormality. Normal gallbladder. No biliary dilatation. Pancreas: Unremarkable. No pancreatic ductal dilatation or surrounding inflammatory changes. Spleen: Normal in size without focal abnormality. Adrenals/Urinary Tract: Adrenal glands are unremarkable. Kidneys are normal, without renal calculi, focal lesion, or hydronephrosis. Bladder is unremarkable. Stomach/Bowel: Stomach is within normal limits. Appendix appears normal. No evidence of bowel wall thickening, distention, or inflammatory changes. Vascular/Lymphatic: No significant vascular findings are present. No enlarged abdominal or pelvic lymph nodes. Reproductive: Uterus and bilateral adnexa are unremarkable. Other: Trace free fluid in  the pelvis is likely physiologic. No pneumoperitoneum. Musculoskeletal: No acute or significant osseous findings. IMPRESSION: 1. No acute intra-abdominal process. Electronically Signed   By: Obie Dredge M.D.   On: 12/14/2023 16:39     Procedures Procedures   Medications Ordered in ED Medications  ondansetron (ZOFRAN-ODT) disintegrating tablet 4 mg (4 mg Oral Given 12/14/23 1346)  sodium chloride 0.9 % bolus 1,000 mL (1,000 mLs Intravenous New Bag/Given 12/14/23 1420)  droperidol (INAPSINE) 2.5 MG/ML injection 1.25 mg (1.25 mg Intravenous Given 12/14/23 1424)  diphenhydrAMINE (BENADRYL) injection 25 mg (25 mg Intravenous Given 12/14/23 1425)  iohexol (OMNIPAQUE) 300 MG/ML solution 100 mL (100 mLs Intravenous Contrast Given 12/14/23 1445)    ED Course/ Medical Decision Making/ A&P Clinical Course as of 12/14/23 1948  Tue Dec 14, 2023  1534 Patient is resting on the stretcher. Does not  appear in acute distress. Reports that she is feeling a lot better. Discussed with her that we are still waiting on CT imaging to return.  [RR]    Clinical Course User Index [RR] Achille Rich, PA-C     Medical Decision Making Amount and/or Complexity of Data Reviewed Labs: ordered. Radiology: ordered.  Risk Prescription drug management.   32 y.o. female presents to the ER for evaluation of upper abdominal pain, nausea, vomiting. Differential diagnosis includes but is not limited to PUD, GERD, gastritis, pancreatitis, gastroparesis, malignancy, biliary disease, ACS, pericarditis, pneumonia, intestinal ischemia, esophageal rupture, hepatitis. Vital signs mild bradycardia, otherwise unremarkable. Physical exam as noted above.   On previous chart evaluation, patient's been seen multiple times at different hospital systems for nausea vomiting and epigastric pain.  She has been referred to GI however has not followed up.  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  Her marijuana drug test did come back positive today and reports that she did ingest a pot brownie on Thanksgiving but has not had any marijuana use since then.  The patient was still feeling nauseous with the Zofran.  Droperidol and Benadryl ordered as well as a liter of normal saline.  I independently reviewed and interpreted the patient's labs.  UDS positive for THC.  Urinalysis shows trace 1 hemoglobin with greater than 80 ketones present and 30 protein.  Rare bacteria but no leukocytes, nitrites, or white blood cells.  Otherwise no signs of infection.  CMP shows mildly decreased sodium 134, glucose at 127 with a mildly decreased bicarb at 20 with a normal anion gap.  Total bili mildly elevated 1.6 however this appears chronic for the patient.  hCG is negative.  Lipase within normal limits.  CBC with leukocytosis or anemia.  CT imaging shows  1. No acute intra-abdominal process.  Per radiologist's interpretation.    On reevaluation, the patient reports that she is feeling much better.  Her abdomen is soft and nontender to palpation. Vital signs still show occasional bradycardia.  I see the patient has been as low as 60 in recent visits.  Also appears that she may be having some premature atrial complexes. Will include this in her discharge paperwork. She has been able to tolerate PO since then.   Abdominal pain and nausea and vomiting have improved/stable together.  Patient reports that she feels safer discharge home.  CT imaging does not reveal any gallbladder etiology or any other acute intra-abdominal etiology.  Patient's presented with the symptoms multiple times before and has not had any GI follow-up.  She assures me she will  follow-up with GI at [facility].  Encouraged her a bland diet and cessation from marijuana use.  I will discharge her home with some Zofran as needed as well as Protonix to take daily.  She stable for discharge home with close outpatient GI follow-up  We discussed the results of the labs/imaging. The plan is follow-up with GI, bland diet, take medication as prescribed/needed. We discussed strict return precautions and red flag symptoms. The patient verbalized their understanding and agrees to the plan. The patient is stable and being discharged home in good condition.  Portions of this report may have been transcribed using voice recognition software. Every effort was made to ensure accuracy; however, inadvertent computerized transcription errors may be present.   Final Clinical Impression(s) / ED Diagnoses Final diagnoses:  Nausea and vomiting, unspecified vomiting type  Epigastric abdominal pain  PAC (premature atrial contraction)    Rx / DC Orders ED Discharge Orders          Ordered    pantoprazole (PROTONIX) 20 MG tablet  Daily        12/14/23 1955    ondansetron (ZOFRAN) 4 MG tablet  Every 6 hours        12/14/23 1955     Ambulatory referral to Cardiology        12/14/23 1957              Achille Rich, PA-C 12/17/23 1726    Sloan Leiter, DO 12/23/23 2306

## 2023-12-14 NOTE — ED Notes (Signed)
Patient stated that they are not able to urinate at this time. Will try later

## 2023-12-14 NOTE — ED Triage Notes (Signed)
Per EMS  Pt states having dull pain in pelvic region with N/V/D that started this am   Pt is sexually active

## 2023-12-14 NOTE — ED Notes (Signed)
Pt half in and half wheelchair, very agitated at this time

## 2023-12-14 NOTE — Discharge Instructions (Addendum)
You were seen in the emergency room today for evaluation of your abdominal pain with nausea and vomiting.  Your CT imaging was unremarkable.  Your lab work showed some signs of dehydration.  You will need to follow-up with your GI specialist at [facility].  I am prescribing you 2 medications.  A medication called Zofran, which can take as needed for nausea.  A medication called Protonix that she can take every day for the next 2 weeks.  Additionally, you have some premature atrial complexes with your EKG.  I would like for you to follow-up with a cardiologist for this.  You can see the one at [facility] however I have put in an ambulatory referral for you.  Please make sure you are staying well-hydrated drinking plenty of fluids, any water.  I recommend a bland diet as well.  Included more omission on bland diet, abdominal pain, nausea vomiting I did the discharge paperwork.  Please review.  If you have any concerns, new or worsening symptoms, please return to the nearest emergency department for reevaluation.  Contact a doctor if: Your belly pain changes or gets worse. You have very bad cramping or bloating in your belly. You vomit. Your pain gets worse with meals, after eating, or with certain foods. You have trouble pooping or have watery poop for more than 2-3 days. You are not hungry, or you lose weight without trying. You have signs of not getting enough fluid or water (dehydration). These may include: Dark pee, very little pee, or no pee. Cracked lips or dry mouth. Feeling sleepy or weak. You have pain when you pee or poop. Your belly pain wakes you up at night. You have blood in your pee. You have a fever. Get help right away if: You cannot stop vomiting. Your pain is only in one part of your belly, like on the right side. You have bloody or black poop, or poop that looks like tar. You have trouble breathing. You have chest pain. These symptoms may be an emergency. Get help  right away. Call 911. Do not wait to see if the symptoms will go away. Do not drive yourself to the hospital.

## 2023-12-14 NOTE — ED Provider Triage Note (Signed)
Emergency Medicine Provider Triage Evaluation Note  Gloria Gonzales , a 32 y.o. female  was evaluated in triage.  Pt complains of upper abdominal pain, not lower. Reports having multiple episodes of nausea, vomiting, and diarrhea this morning. She has been seen for these symptoms before, but has not followed up with a GI specialist. Denies any fever or black or bloody stools.  Review of Systems  Positive:  Negative:   Physical Exam  BP (!) 133/92 (BP Location: Right Arm)   Pulse (!) 47   Temp 97.9 F (36.6 C) (Oral)   Resp 18   Ht 5\' 6"  (1.676 m)   Wt 79.6 kg   LMP 11/16/2023   SpO2 99%   BMI 28.32 kg/m  Gen:   Awake, no distress   Resp:  Normal effort  MSK:   Moves extremities without difficulty  Other:  Diffuse abdominal tenderness, more upper than lower.   Medical Decision Making  Medically screening exam initiated at 1:36 PM.  Appropriate orders placed.  Bethanee Mushtaq was informed that the remainder of the evaluation will be completed by another provider, this initial triage assessment does not replace that evaluation, and the importance of remaining in the ED until their evaluation is complete.  New bradycardia. EKG ordered. Nursing aware. Ct and labs ordered.    Achille Rich, [place] 12/14/23 1339

## 2023-12-14 NOTE — ED Notes (Signed)
Pt. Up walking in hallway.  Pt. Asking for ginger ale.  Pt. Needs socks.  Pt. Asking about going home.

## 2024-01-26 ENCOUNTER — Ambulatory Visit: Payer: Medicaid Other | Admitting: Cardiovascular Disease

## 2024-03-13 ENCOUNTER — Emergency Department (HOSPITAL_BASED_OUTPATIENT_CLINIC_OR_DEPARTMENT_OTHER): Admission: EM | Admit: 2024-03-13 | Discharge: 2024-03-13 | Disposition: A | Discharge status: Home / Self Care

## 2024-03-13 ENCOUNTER — Other Ambulatory Visit: Payer: Self-pay

## 2024-03-13 ENCOUNTER — Encounter (HOSPITAL_BASED_OUTPATIENT_CLINIC_OR_DEPARTMENT_OTHER): Payer: Self-pay | Admitting: *Deleted

## 2024-03-13 DIAGNOSIS — R1013 Epigastric pain: Secondary | ICD-10-CM | POA: Insufficient documentation

## 2024-03-13 DIAGNOSIS — E860 Dehydration: Secondary | ICD-10-CM | POA: Insufficient documentation

## 2024-03-13 DIAGNOSIS — R112 Nausea with vomiting, unspecified: Secondary | ICD-10-CM | POA: Diagnosis present

## 2024-03-13 LAB — URINALYSIS, ROUTINE W REFLEX MICROSCOPIC
Glucose, UA: NEGATIVE mg/dL
Ketones, ur: 40 mg/dL — AB
Leukocytes,Ua: NEGATIVE
Nitrite: NEGATIVE
Protein, ur: >=300 mg/dL — AB
Specific Gravity, Urine: 1.025 (ref 1.005–1.030)
pH: 7 (ref 5.0–8.0)

## 2024-03-13 LAB — COMPREHENSIVE METABOLIC PANEL
ALT: 36 U/L (ref 0–44)
AST: 26 U/L (ref 15–41)
Albumin: 4.8 g/dL (ref 3.5–5.0)
Alkaline Phosphatase: 48 U/L (ref 38–126)
Anion gap: 13 (ref 5–15)
BUN: 16 mg/dL (ref 6–20)
CO2: 27 mmol/L (ref 22–32)
Calcium: 9.9 mg/dL (ref 8.9–10.3)
Chloride: 95 mmol/L — ABNORMAL LOW (ref 98–111)
Creatinine, Ser: 0.9 mg/dL (ref 0.44–1.00)
GFR, Estimated: >60 mL/min (ref >60)
Glucose, Bld: 148 mg/dL — ABNORMAL HIGH (ref 70–99)
Potassium: 3.7 mmol/L (ref 3.5–5.1)
Sodium: 135 mmol/L (ref 135–145)
Total Bilirubin: 1.3 mg/dL — ABNORMAL HIGH (ref 0.0–1.2)
Total Protein: 8.8 g/dL — ABNORMAL HIGH (ref 6.5–8.1)

## 2024-03-13 LAB — URINALYSIS, MICROSCOPIC (REFLEX)

## 2024-03-13 LAB — CBC
HCT: 40.9 % (ref 36.0–46.0)
Hemoglobin: 14.1 g/dL (ref 12.0–15.0)
MCH: 32.2 pg (ref 26.0–34.0)
MCHC: 34.5 g/dL (ref 30.0–36.0)
MCV: 93.4 fL (ref 80.0–100.0)
Platelets: 321 10*3/uL (ref 150–400)
RBC: 4.38 MIL/uL (ref 3.87–5.11)
RDW: 12.8 % (ref 11.5–15.5)
WBC: 9 10*3/uL (ref 4.0–10.5)
nRBC: 0 % (ref 0.0–0.2)

## 2024-03-13 LAB — RESP PANEL BY RT-PCR (RSV, FLU A&B, COVID)  RVPGX2
Influenza A by PCR: NEGATIVE
Influenza B by PCR: NEGATIVE
Resp Syncytial Virus by PCR: NEGATIVE
SARS Coronavirus 2 by RT PCR: NEGATIVE

## 2024-03-13 LAB — LIPASE, BLOOD: Lipase: 26 U/L (ref 11–51)

## 2024-03-13 LAB — PREGNANCY, URINE: Preg Test, Ur: NEGATIVE

## 2024-03-13 MED ORDER — ONDANSETRON 4 MG PO TBDP: 4.0000 mg | ORAL_TABLET | Freq: Three times a day (TID) | ORAL | 0 refills | Status: DC | PRN

## 2024-03-13 MED ORDER — LACTATED RINGERS IV BOLUS
1000.0000 mL | Freq: Once | INTRAVENOUS | Status: AC
  Administered 2024-03-13: 1000 mL via INTRAVENOUS

## 2024-03-13 MED ORDER — DIPHENHYDRAMINE HCL 50 MG/ML IJ SOLN
50.0000 mg | Freq: Once | INTRAMUSCULAR | Status: AC
  Administered 2024-03-13: 50 mg via INTRAVENOUS
  Filled 2024-03-13: qty 1

## 2024-03-13 MED ORDER — ALUM & MAG HYDROXIDE-SIMETH 200-200-20 MG/5ML PO SUSP
30.0000 mL | Freq: Once | ORAL | Status: AC
  Administered 2024-03-13: 30 mL via ORAL
  Filled 2024-03-13: qty 30

## 2024-03-13 MED ORDER — DROPERIDOL 2.5 MG/ML IJ SOLN
2.5000 mg | Freq: Once | INTRAMUSCULAR | Status: AC
  Administered 2024-03-13: 2.5 mg via INTRAVENOUS
  Filled 2024-03-13: qty 2

## 2024-03-13 NOTE — ED Triage Notes (Signed)
 Pt began having nausea and vomiting, no fever on Saturday.  Pt has hx of same and is waiting to see GI regarding this.  No sick contacts.

## 2024-03-13 NOTE — ED Provider Notes (Signed)
 [facility] Provider Note   CSN: [id number] Arrival date & time: 03/13/24  1502     History  Chief Complaint  Patient presents with   Emesis    Gloria Gonzales is a 33 y.o. female.  33 year old female with no known past medical history presenting to the emergency department today with nausea and vomiting.  The patient states that over the past 48 hours that she has been having multiple episodes of nonbloody, nonbilious emesis.  The patient states that she has not really had any diarrhea with this.  States she is having some mild epigastric pain after multiple episodes of vomiting.  She states that she has had recurrent issues with this in the past.  She states that she was smoking marijuana but has since stopped and this is the first time that it is recurred since she has stopped.  She does have an appointment with the gastroenterologist on the 31st of [facility] for further evaluation.  She denies a history of abdominal surgeries.  She is passing flatus.   Emesis Associated symptoms: abdominal pain        Home Medications Prior to Admission medications   Medication Sig Start Date End Date Taking? Authorizing Provider  ondansetron (ZOFRAN-ODT) 4 MG disintegrating tablet Take 1 tablet (4 mg total) by mouth every 8 (eight) hours as needed for nausea or vomiting. 03/13/24  Yes Durwin Glaze, MD  metoCLOPramide (REGLAN) 10 MG tablet Take 1 tablet (10 mg total) by mouth every 6 (six) hours as needed for nausea or vomiting. 06/21/23   Molpus, John, MD  Multiple Vitamin (MULTIVITAMIN WITH MINERALS) TABS tablet Take 1 tablet by mouth daily.    [provider]  ondansetron (ZOFRAN) 4 MG tablet Take 1 tablet (4 mg total) by mouth every 6 (six) hours. 12/14/23   Achille Rich, PA-C  ondansetron (ZOFRAN-ODT) 4 MG disintegrating tablet Take 1 tablet (4 mg total) by mouth every 8 (eight) hours as needed. 09/01/23   Rancour, Jeannett Senior, MD  pantoprazole  (PROTONIX) 20 MG tablet Take 1 tablet (20 mg total) by mouth daily. 12/14/23   Achille Rich, PA-C  Prenatal Vit-Fe Fumarate-FA (PRENATAL VITAMIN PO) Take 1 tablet by mouth daily.    [provider]  promethazine (PHENERGAN) 25 MG tablet Take 1 tablet (25 mg total) by mouth every 6 (six) hours as needed for nausea or vomiting. 04/21/20   Horton, Mayer Masker, MD  pyridOXINE (VITAMIN B-6) 100 MG tablet Take 100 mg by mouth daily.    [provider]  scopolamine (TRANSDERM-SCOP) 1 MG/3DAYS Place 1 patch (1.5 mg total) onto the skin every 3 (three) days. 10/27/22   Carlynn Herald, CNM  sucralfate (CARAFATE) 1 g tablet Take 1 tablet (1 g total) by mouth 4 (four) times daily -  with meals and at bedtime. 06/21/23   Molpus, John, MD      Allergies    Shrimp [shellfish allergy] and Clonazepam    Review of Systems   Review of Systems  Gastrointestinal:  Positive for abdominal pain, nausea and vomiting.  All other systems reviewed and are negative.   Physical Exam Updated Vital Signs BP 108/69   Pulse 62   Temp 97.8 F (36.6 C)   Resp 20   LMP 03/06/2024   SpO2 100%  Physical Exam Vitals and nursing note reviewed.   Gen: Appears uncomfortable Eyes: PERRL, EOMI HEENT: no oropharyngeal swelling, dry mucous membranes Neck: trachea midline Resp: clear to  auscultation bilaterally Card: RRR, no murmurs, rubs, or gallops Abd: Mild epigastric tenderness without guarding or rebound Extremities: no calf tenderness, no edema Vascular: 2+ radial pulses bilaterally, 2+ DP pulses bilaterally Skin: no rashes Psyc: acting appropriately   ED Results / Procedures / Treatments   Labs (all labs ordered are listed, but only abnormal results are displayed) Labs Reviewed  COMPREHENSIVE METABOLIC PANEL - Abnormal; Notable for the following components:      Result Value   Chloride 95 (*)    Glucose, Bld 148 (*)    Total Protein 8.8 (*)    Total Bilirubin 1.3 (*)    All other  components within normal limits  URINALYSIS, ROUTINE W REFLEX MICROSCOPIC - Abnormal; Notable for the following components:   Hgb urine dipstick TRACE (*)    Bilirubin Urine SMALL (*)    Ketones, ur 40 (*)    Protein, ur >=300 (*)    All other components within normal limits  URINALYSIS, MICROSCOPIC (REFLEX) - Abnormal; Notable for the following components:   Bacteria, UA RARE (*)    All other components within normal limits  RESP PANEL BY RT-PCR (RSV, FLU A&B, COVID)  RVPGX2  LIPASE, BLOOD  CBC  PREGNANCY, URINE    EKG None  Radiology No results found.  Procedures Procedures    Medications Ordered in ED Medications  alum & mag hydroxide-simeth (MAALOX/MYLANTA) 200-200-20 MG/5ML suspension 30 mL (has no administration in time range)  lactated ringers bolus 1,000 mL (1,000 mLs Intravenous New Bag/Given 03/13/24 1706)  droperidol (INAPSINE) 2.5 MG/ML injection 2.5 mg (2.5 mg Intravenous Given 03/13/24 1709)  diphenhydrAMINE (BENADRYL) injection 50 mg (50 mg Intravenous Given 03/13/24 1742)    ED Course/ Medical Decision Making/ A&P                                 Medical Decision Making 33 year old female with recurrent episodes of nausea and vomiting in the past presenting to the emergency department today with symptoms consistent with likely gastroparesis/hyperemesis flare.  Patient's abdominal exam is reassuring.  Does appear that she has had imaging studies multiple times in the past and her exam is reassuring so we will hold off on this at this time.  I will give the patient IV fluids.  Will obtain basic labs to eval for electrolyte abnormalities.  Also obtain EKG to evaluate patient's QTc to see what sorts of medications we can try for this.  I give patient IV fluids as well as Inapsine of her EKG is reassuring as she improved last time with this.  If she is no longer vomiting give her GI cocktail.  I will reevaluate for ultimate disposition.  Suspicion for appendicitis,  diverticulitis, bowel obstruction, or other serious intra-abdominal pathology is low at this time given the recurrent nature and reassuring abdominal exam with stable vital signs.  The patient's labs are reassuring.  She does have some ketones in her urine consistent with dehydration.  Symptoms resolved after the droperidol and Benadryl.  She did have some akathisia which did resolve with the Benadryl.  She is feeling better on reassessment.  She is tolerating p.o. and is discharged with return precautions.  Amount and/or Complexity of Data Reviewed Labs: ordered.  Risk OTC drugs. Prescription drug management.           Final Clinical Impression(s) / ED Diagnoses Final diagnoses:  Dehydration  Nausea and vomiting, unspecified vomiting type  Rx / DC Orders ED Discharge Orders          Ordered    ondansetron (ZOFRAN-ODT) 4 MG disintegrating tablet  Every 8 hours PRN        03/13/24 1844              Durwin Glaze, MD 03/13/24 1845

## 2024-03-13 NOTE — Discharge Instructions (Addendum)
 Your workup here today was reassuring.  You do appear to be dehydrated based on your labs and urine.  Please take the Zofran as needed and follow-up with the gastroenterologist as scheduled later this month.  Return to the ER for worsening symptoms.

## 2024-03-27 ENCOUNTER — Ambulatory Visit: Payer: Medicaid Other | Attending: Cardiovascular Disease | Admitting: Cardiovascular Disease

## 2024-03-28 ENCOUNTER — Encounter: Payer: Self-pay | Admitting: Cardiovascular Disease

## 2024-11-23 ENCOUNTER — Encounter (HOSPITAL_BASED_OUTPATIENT_CLINIC_OR_DEPARTMENT_OTHER): Payer: Self-pay

## 2024-11-23 ENCOUNTER — Emergency Department (HOSPITAL_BASED_OUTPATIENT_CLINIC_OR_DEPARTMENT_OTHER)

## 2024-11-23 ENCOUNTER — Emergency Department (HOSPITAL_BASED_OUTPATIENT_CLINIC_OR_DEPARTMENT_OTHER)
Admission: EM | Admit: 2024-11-23 | Discharge: 2024-11-23 | Disposition: A | Discharge status: Home / Self Care | Attending: Emergency Medicine | Admitting: Emergency Medicine

## 2024-11-23 ENCOUNTER — Other Ambulatory Visit: Payer: Self-pay

## 2024-11-23 DIAGNOSIS — E876 Hypokalemia: Secondary | ICD-10-CM | POA: Diagnosis not present

## 2024-11-23 DIAGNOSIS — R1013 Epigastric pain: Secondary | ICD-10-CM

## 2024-11-23 DIAGNOSIS — K828 Other specified diseases of gallbladder: Secondary | ICD-10-CM | POA: Insufficient documentation

## 2024-11-23 DIAGNOSIS — R112 Nausea with vomiting, unspecified: Secondary | ICD-10-CM

## 2024-11-23 LAB — COMPREHENSIVE METABOLIC PANEL WITH GFR
ALT: 31 U/L (ref 0–44)
AST: 23 U/L (ref 15–41)
Albumin: 4.9 g/dL (ref 3.5–5.0)
Alkaline Phosphatase: 55 U/L (ref 38–126)
Anion gap: 14 (ref 5–15)
BUN: 9 mg/dL (ref 6–20)
CO2: 32 mmol/L (ref 22–32)
Calcium: 10.1 mg/dL (ref 8.9–10.3)
Chloride: 92 mmol/L — ABNORMAL LOW (ref 98–111)
Creatinine, Ser: 1.19 mg/dL — ABNORMAL HIGH (ref 0.44–1.00)
GFR, Estimated: >60 mL/min (ref >60)
Glucose, Bld: 132 mg/dL — ABNORMAL HIGH (ref 70–99)
Potassium: 3.2 mmol/L — ABNORMAL LOW (ref 3.5–5.1)
Sodium: 138 mmol/L (ref 135–145)
Total Bilirubin: 1.1 mg/dL (ref 0.0–1.2)
Total Protein: 8.8 g/dL — ABNORMAL HIGH (ref 6.5–8.1)

## 2024-11-23 LAB — URINALYSIS, ROUTINE W REFLEX MICROSCOPIC
Bilirubin Urine: NEGATIVE
Glucose, UA: NEGATIVE mg/dL
Ketones, ur: NEGATIVE mg/dL
Nitrite: NEGATIVE
Protein, ur: 30 mg/dL — AB
Specific Gravity, Urine: 1.015 (ref 1.005–1.030)
pH: 8.5 — ABNORMAL HIGH (ref 5.0–8.0)

## 2024-11-23 LAB — URINE DRUG SCREEN
Amphetamines: NEGATIVE
Barbiturates: NEGATIVE
Benzodiazepines: NEGATIVE
Cocaine: NEGATIVE
Fentanyl: NEGATIVE
Methadone Scn, Ur: NEGATIVE
Opiates: NEGATIVE
Tetrahydrocannabinol: POSITIVE — AB

## 2024-11-23 LAB — CBC
HCT: 43.6 % (ref 36.0–46.0)
Hemoglobin: 15.3 g/dL — ABNORMAL HIGH (ref 12.0–15.0)
MCH: 33.3 pg (ref 26.0–34.0)
MCHC: 35.1 g/dL (ref 30.0–36.0)
MCV: 94.8 fL (ref 80.0–100.0)
Platelets: 339 K/uL (ref 150–400)
RBC: 4.6 MIL/uL (ref 3.87–5.11)
RDW: 11.9 % (ref 11.5–15.5)
WBC: 11.4 K/uL — ABNORMAL HIGH (ref 4.0–10.5)
nRBC: 0 % (ref 0.0–0.2)

## 2024-11-23 LAB — URINALYSIS, MICROSCOPIC (REFLEX)

## 2024-11-23 LAB — PREGNANCY, URINE: Preg Test, Ur: NEGATIVE

## 2024-11-23 LAB — HCG, QUANTITATIVE, PREGNANCY: hCG, Beta Chain, Quant, S: <1 m[IU]/mL (ref <5)

## 2024-11-23 LAB — LIPASE, BLOOD: Lipase: 19 U/L (ref 11–51)

## 2024-11-23 MED ORDER — ONDANSETRON 4 MG PO TBDP: 4.0000 mg | ORAL_TABLET | Freq: Three times a day (TID) | ORAL | 0 refills | Status: AC | PRN

## 2024-11-23 MED ORDER — LACTATED RINGERS IV BOLUS
1000.0000 mL | Freq: Once | INTRAVENOUS | Status: AC
  Administered 2024-11-23: 1000 mL via INTRAVENOUS

## 2024-11-23 MED ORDER — DIPHENHYDRAMINE HCL 50 MG/ML IJ SOLN
12.5000 mg | Freq: Once | INTRAMUSCULAR | Status: AC
  Administered 2024-11-23: 12.5 mg via INTRAVENOUS
  Filled 2024-11-23: qty 1

## 2024-11-23 MED ORDER — METOCLOPRAMIDE HCL 5 MG/ML IJ SOLN
5.0000 mg | Freq: Once | INTRAMUSCULAR | Status: AC
  Administered 2024-11-23: 5 mg via INTRAVENOUS
  Filled 2024-11-23: qty 2

## 2024-11-23 MED ORDER — POTASSIUM CHLORIDE CRYS ER 20 MEQ PO TBCR
40.0000 meq | EXTENDED_RELEASE_TABLET | Freq: Once | ORAL | Status: AC
  Administered 2024-11-23: 40 meq via ORAL
  Filled 2024-11-23: qty 2

## 2024-11-23 MED ORDER — DROPERIDOL 2.5 MG/ML IJ SOLN
1.2500 mg | Freq: Once | INTRAMUSCULAR | Status: AC
  Administered 2024-11-23: 1.25 mg via INTRAVENOUS
  Filled 2024-11-23: qty 2

## 2024-11-23 MED ORDER — PANTOPRAZOLE SODIUM 40 MG IV SOLR
40.0000 mg | Freq: Once | INTRAVENOUS | Status: AC
  Administered 2024-11-23: 40 mg via INTRAVENOUS
  Filled 2024-11-23: qty 10

## 2024-11-23 MED ORDER — PANTOPRAZOLE SODIUM 20 MG PO TBEC: 20.0000 mg | DELAYED_RELEASE_TABLET | Freq: Every day | ORAL | 0 refills | Status: AC

## 2024-11-23 NOTE — ED Notes (Signed)
 States is feeling better. Will po trial

## 2024-11-23 NOTE — ED Notes (Signed)
 Patient aware of need for urine sample ?

## 2024-11-23 NOTE — ED Notes (Signed)
 Given Gingerale for PO fluid trial

## 2024-11-23 NOTE — ED Provider Notes (Signed)
  EMER[facility]ENCY DEPARTMENT AT [facility] Provider Note   CSN: [id number] Arrival date & time: 11/23/24  9144     Patient presents with: Emesis   Gloria Gonzales is a 33 y.o. female self reportedly otherwise healthy presents to the ER today for evaluation of nausea, vomiting, diarrhea, and epigastric pain. The patient has been having these episodes around 3-4 times a year for over a year. She reports that she was initially referred to GI however was lost to follow up. She has been having this newest episode since Monday.  She reports the majority of her symptoms are nausea and vomiting with some occasional loose stools.  She having some intermittent epigastric pain but mainly nausea as her main complaint.  Denies any fevers, chills, chest pain, shortness of breath, black or bloody emesis.  Sudden bowel movements and passing gas.  No black or bloody stools.  She denies any tobacco, EtOH, or drug use.  Ports that she has a former history of marijuana however has not used in some time.  She was seen by her PCP yesterday and had another referral for a GI provider given.  She reports that she was frustrated with her PCP as she was not given sample tablets for symptoms and is requesting Zofran  ODT today.  She is also given Carafate  however has not trialed any this medication given to her by PCP.  Emesis Associated symptoms: abdominal pain and diarrhea   Associated symptoms: no chills and no fever        Prior to Admission medications   Medication Sig Start Date End Date Taking? Authorizing Provider  metoCLOPramide  (REGLAN ) 10 MG tablet Take 1 tablet (10 mg total) by mouth every 6 (six) hours as needed for nausea or vomiting. 06/21/23   Molpus, John, MD  Multiple Vitamin (MULTIVITAMIN WITH MINERALS) TABS tablet Take 1 tablet by mouth daily.    [provider]  ondansetron  (ZOFRAN ) 4 MG tablet Take 1 tablet (4 mg total) by mouth every 6 (six) hours. 12/14/23   Bernis Ernst,  PA-C  ondansetron  (ZOFRAN -ODT) 4 MG disintegrating tablet Take 1 tablet (4 mg total) by mouth every 8 (eight) hours as needed. 09/01/23   Rancour, Garnette, MD  ondansetron  (ZOFRAN -ODT) 4 MG disintegrating tablet Take 1 tablet (4 mg total) by mouth every 8 (eight) hours as needed for nausea or vomiting. 03/13/24   Ula Prentice SAUNDERS, MD  pantoprazole  (PROTONIX ) 20 MG tablet Take 1 tablet (20 mg total) by mouth daily. 12/14/23   Bernis Ernst, PA-C  Prenatal Vit-Fe Fumarate-FA (PRENATAL VITAMIN PO) Take 1 tablet by mouth daily.    [provider]  promethazine  (PHENERGAN ) 25 MG tablet Take 1 tablet (25 mg total) by mouth every 6 (six) hours as needed for nausea or vomiting. 04/21/20   Horton, Charmaine FALCON, MD  pyridOXINE (VITAMIN B-6) 100 MG tablet Take 100 mg by mouth daily.    [provider]  scopolamine  (TRANSDERM-SCOP) 1 MG/3DAYS Place 1 patch (1.5 mg total) onto the skin every 3 (three) days. 10/27/22   Emilio Delilah HERO, CNM  sucralfate  (CARAFATE ) 1 g tablet Take 1 tablet (1 g total) by mouth 4 (four) times daily -  with meals and at bedtime. 06/21/23   Molpus, John, MD    Allergies: Shrimp [shellfish allergy] and Clonazepam    Review of Systems  Constitutional:  Negative for chills and fever.  Respiratory:  Negative for shortness of breath.   Cardiovascular:  Negative for chest pain.  Gastrointestinal:  Positive for abdominal pain, diarrhea, nausea and vomiting. Negative for constipation.  Genitourinary:  Negative for difficulty urinating, dysuria, hematuria, vaginal bleeding and vaginal discharge.    Updated Vital Signs BP (!) 139/98   Pulse 65   Temp 98.1 F (36.7 C) (Oral)   Resp 18   Ht 5' 6 (1.676 m)   Wt 79.8 kg   LMP 11/18/2024 (Approximate)   SpO2 100%   BMI 28.41 kg/m   Physical Exam Vitals and nursing note reviewed.  Constitutional:      Appearance: She is not toxic-appearing.     Comments: Uncomfortable, but nontoxic-appearing  HENT:      Mouth/Throat:     Mouth: Mucous membranes are dry.  Eyes:     General: No scleral icterus. Cardiovascular:     Rate and Rhythm: Normal rate.  Pulmonary:     Effort: Pulmonary effort is normal. No respiratory distress.  Abdominal:     General: There is no distension.     Palpations: Abdomen is soft.     Tenderness: There is no right CVA tenderness, left CVA tenderness, guarding or rebound.     Comments: Mild epigastric tenderness without guarding or rebound. Soft.   Skin:    General: Skin is warm and dry.  Neurological:     Mental Status: She is alert.     Gait: Gait normal.     (all labs ordered are listed, but only abnormal results are displayed) Labs Reviewed  CBC - Abnormal; Notable for the following components:      Result Value   WBC 11.4 (*)    Hemoglobin 15.3 (*)    All other components within normal limits  LIPASE, BLOOD  COMPREHENSIVE METABOLIC PANEL WITH GFR  URINALYSIS, ROUTINE W REFLEX MICROSCOPIC  PREGNANCY, URINE  HCG, QUANTITATIVE, PREGNANCY    EKG: None  Radiology: US  Abdomen Limited RUQ (LIVER/GB) Result Date: 11/23/2024 EXAM: Right Upper Quadrant Abdominal Ultrasound 11/23/2024 10:47:11 AM TECHNIQUE: Real-time ultrasonography of the right upper quadrant of the abdomen was performed. COMPARISON: None available. CLINICAL HISTORY: 885158 Epigastric abdominal pain 114841 Epigastric abdominal pain. FINDINGS: LIVER: The liver demonstrates normal echogenicity. Patent portal vein with antegrade flow. 2.3 cm hyperechoic lesion in the right hepatic lobe compatible with a benign hemangioma. No intrahepatic biliary ductal dilatation. BILIARY SYSTEM: Sludge is present in the gallbladder without wall thickening or pericholecystic fluid. No sonographic Murphy sign. Common bile duct measures 3 mm. OTHER: No right upper quadrant ascites. IMPRESSION: 1. Sludge in the gallbladder without evidence of acute cholecystitis. Electronically signed by: Norman Gatlin MD 11/23/2024  10:52 AM EST RP Workstation: [id number]    Procedures   Medications Ordered in the ED  lactated ringers  bolus 1,000 mL (has no administration in time range)  metoCLOPramide  (REGLAN ) injection 5 mg (has no administration in time range)  diphenhydrAMINE  (BENADRYL ) injection 12.5 mg (has no administration in time range)  pantoprazole  (PROTONIX ) injection 40 mg (has no administration in time range)                               Medical Decision Making Amount and/or Complexity of Data Reviewed Labs: ordered. Radiology: ordered.  Risk Prescription drug management.   33 y.o. female presents to the ER for evaluation of nausea, vomiting, and occasional epigastric pain. Differential diagnosis includes but is not limited to ACS/MI, Boerhaave's, DKA, elevated ICP, Ischemic bowel, Sepsis, drug-related, Appendicitis, Bowel obstruction, Electrolyte abnormalities, Pancreatitis, Biliary colic, Gastroenteritis, Gastroparesis, Hepatitis,  Migraine, Thyroid  disease, Renal colic, PUD, UTI, pregnancy. Vital signs mildly elevated BP otherwise unremarkable. Physical exam as noted above.   Patient has had several past visits over the past few years for nausea, vomiting, and epigastric pain.  She has never followed up with a GI specialist.  She reports this feels like her previous episodes.  She does have some minimal epigastric tenderness, will obtain right upper quadrant ultrasound as well as labs.  Initially treated with some fluids and Reglan , Benadryl  given for potential dystonic reaction.  I independently reviewed and interpreted the patient's labs.  hCG negative, urinalysis shows pH of 8.5, large hemoglobin with 30 protein and trace leukocytes with rare bacteria but no white blood cells, red blood cells, or nitrites present.  Doubt infection given she is not have any other symptoms.  The CBC does show slight leukocytosis 11.4 but also has some hemoconcentration hemoglobin 15.3, likely due to dehydration from  decreased p.o. intake with nausea vomiting.  CMP also shows potassium 3.2, chloride of 92, glucose of 132.  Creatinine 1.19, appears patient's baseline is around 0.9.  Total protein 8.8.  No other electrolyte or LFT abnormalities.  Lipase within normal limits.  UDS positive for THC.  US  RUQ 1. Sludge in the gallbladder without evidence of acute cholecystitis. Per radiologist's interpretation.    On re-evaluation, the patient reports that she is feeling slightly better. Will order PO potassium replinishment and droperidol .   On reevaluation after medications, patient has tolerated p.o. with a full bottle of Gatorade and ginger ale.  No emesis.  Reports that she is feeling much better.  Will discharge home.  Conversed with patient regarding medications to take, will send she is already prescribed Carafate  will send her home with Zofran  ODT and Prilosec.  Recommended and stressed the importance of following up with GI provider given her ultrasound findings I am continued symptoms.  Given that her LFTs within normal limits and her lipase is within normal limits as well she is afebrile, I do not think this is any infection requiring admission for the gallbladder sludge.  She is not having any black or bloody emesis.  She appears much more comfortable now.  Stable for discharge home with strict return precautions.  We discussed the results of the labs/imaging. The plan is GI follow up, bland diet, medications. We discussed strict return precautions and red flag symptoms. The patient verbalized their understanding and agrees to the plan. The patient is stable and being discharged home in good condition.  Portions of this report may have been transcribed using voice recognition software. Every effort was made to ensure accuracy; however, inadvertent computerized transcription errors may be present.    Final diagnoses:  Nausea and vomiting, unspecified vomiting type  Epigastric abdominal pain  Gallbladder  sludge  Hypokalemia    ED Discharge Orders          Ordered    ondansetron  (ZOFRAN -ODT) 4 MG disintegrating tablet  Every 8 hours PRN        11/23/24 1210    pantoprazole  (PROTONIX ) 20 MG tablet  Daily        11/23/24 1210               Bernis Ernst, PA-C 11/23/24 1243    Francesca Elsie CROME, MD 11/24/24 [phone]

## 2024-11-23 NOTE — ED Triage Notes (Signed)
 Pt having gastric problems, throwing up for a few days, went to her doctor yesterday and was told she was dehydrated. Not given IV fluids, was given some pills she has been unable to keep down. Also was referred to a GI doctor after her visit yesterday. Symptoms of N/V/diarrhea ongoing since Monday.

## 2024-11-23 NOTE — ED Notes (Signed)
 Pt informed that she would need a ride home due to Droperidol  side effects. Pt stated she would be able to have someone pick her up when she is discharged. Pt verbalized understanding.

## 2024-11-23 NOTE — Discharge Instructions (Addendum)
 You were seen in the ER today for evaluation of your symptoms. It is very important that you follow up with a GI provider. Please follow up with the referral from your PCP. I would still like for you to take the Carafate  that was given to you. I am prescribing you the dissolvable Zofran  to take as needed for nausea and vomiting. Additionally. I am sending you home with Protonix  to take daily for the next two weeks. The US  of your abdomen shows that you have some gallbladder sludge which may be causing your symptoms. I would stop smoking marijuana, even if only occasionally. Try bland diet and stay well hydrated. I have included more information for you to review into the discharge paperwork. If you have any concerns, new or worsening symptoms, please return to the nearest ER for re-evaluation.    Contact a doctor if: Your symptoms get worse. You have new symptoms. You have a fever. You cannot drink fluids without vomiting. You feel like you may vomit for more than 2 days. You feel light-headed or dizzy. You have a headache. You have muscle cramps. You have a rash. You have pain while peeing. Get help right away if: You have pain in your chest, neck, arm, or jaw. You feel very weak or you faint. You vomit again and again. You have vomit that is bright red or looks like black coffee grounds. You have bloody or black poop (stools) or poop that looks like tar. You have a very bad headache, a stiff neck, or both. You have very bad pain, cramping, or bloating in your belly (abdomen). You have trouble breathing. You are breathing very quickly. Your heart is beating very quickly. Your skin feels cold and clammy. You feel confused. You have signs of losing too much water in your body, such as: Dark pee, very little pee, or no pee. Cracked lips. Dry mouth. Sunken eyes. Sleepiness. Weakness. These symptoms may be an emergency. Get help right away. Call 911. Do not wait to see if the symptoms  will go away. Do not drive yourself to the hospital.
